# Patient Record
Sex: Male | Born: 1990 | Hispanic: Yes | Marital: Single | Smoking: Former smoker
Health system: Southern US, Community
[De-identification: ages and names within clinical notes are randomized; demographics above are authoritative.]

## PROBLEM LIST (undated history)

## (undated) DIAGNOSIS — G4700 Insomnia, unspecified: Secondary | ICD-10-CM

## (undated) DIAGNOSIS — F329 Major depressive disorder, single episode, unspecified: Secondary | ICD-10-CM

## (undated) DIAGNOSIS — F411 Generalized anxiety disorder: Secondary | ICD-10-CM

## (undated) HISTORY — DX: Major depressive disorder, single episode, unspecified: F32.9

## (undated) HISTORY — DX: Insomnia, unspecified: G47.00

## (undated) HISTORY — DX: Generalized anxiety disorder: F41.1

---

## 2014-11-03 ENCOUNTER — Encounter: Payer: Self-pay | Admitting: Emergency Medicine

## 2014-11-03 ENCOUNTER — Emergency Department (INDEPENDENT_AMBULATORY_CARE_PROVIDER_SITE_OTHER)
Admission: EM | Admit: 2014-11-03 | Discharge: 2014-11-03 | Disposition: A | Discharge status: Home / Self Care | Payer: Managed Care, Other (non HMO) | Source: Home / Self Care | Attending: Emergency Medicine | Admitting: Emergency Medicine

## 2014-11-03 DIAGNOSIS — R3915 Urgency of urination: Secondary | ICD-10-CM | POA: Diagnosis not present

## 2014-11-03 DIAGNOSIS — R350 Frequency of micturition: Secondary | ICD-10-CM

## 2014-11-03 LAB — POCT URINALYSIS DIP (MANUAL ENTRY)
Bilirubin, UA: NEGATIVE
Blood, UA: NEGATIVE
Glucose, UA: NEGATIVE
Ketones, POC UA: NEGATIVE
Leukocytes, UA: NEGATIVE
Nitrite, UA: NEGATIVE
Protein Ur, POC: NEGATIVE
Urobilinogen, UA: 0.2 (ref 0–1)
pH, UA: 5.5 (ref 5–8)

## 2014-11-03 NOTE — ED Notes (Signed)
Patient c/o urinary urinary urgency times 3 days, denies pain and discomfort. Negative fever.

## 2014-11-03 NOTE — ED Provider Notes (Signed)
CSN: [id number]     Arrival date & time 11/03/14  1148 History   First MD Initiated Contact with Patient 11/03/14 1205     Chief Complaint  Patient presents with  . Urinary Urgency    HPI Patient c/o urinary urgency times 3 days, denies pain and discomfort. The urgency is worse at night, and has disrupted his sleep somewhat.  No problems with urinary stream Denies fever or chills or abdominal pain or nausea or vomiting. No urethral discharge or any other GU symptoms. No skin lesions or sores. No cardiorespiratory symptoms. He never had this before. He is monogamous with girlfriend for 1 year. However his girlfriend is in [place] for the past 6 months, so he denies any sexual relations in the past 6 months. Denies itch or rash in the GU area. No new soaps or detergents or allergens known. History reviewed. No pertinent past medical history. History reviewed. No pertinent past surgical history. History reviewed. No pertinent family history. History  Substance Use Topics  . Smoking status: Never Smoker   . Smokeless tobacco: Not on file  . Alcohol Use: No    Review of Systems Remainder of Review of Systems negative for acute change except as noted in the HPI.  Allergies  Review of patient's allergies indicates not on file.  Home Medications   Prior to Admission medications   Not on File   BP 124/73 mmHg  Pulse 65  Temp(Src) 98.1 F (36.7 C) (Oral)  Resp 16  Ht 5\' 8"  (1.727 m)  Wt 180 lb (81.647 kg)  BMI 27.38 kg/m2  SpO2 98% Physical Exam  Constitutional: He is oriented to person, place, and time. He appears well-developed and well-nourished. No distress.  HENT:  Head: Normocephalic and atraumatic.  Eyes: Conjunctivae and EOM are normal. Pupils are equal, round, and reactive to light. No scleral icterus.  Neck: Normal range of motion.  Cardiovascular: Normal rate.   Pulmonary/Chest: Effort normal.  Abdominal: He exhibits no distension.  Musculoskeletal: Normal range  of motion.  Neurological: He is alert and oriented to person, place, and time.  Skin: Skin is warm.  Psychiatric: He has a normal mood and affect.  Nursing note and vitals reviewed.  He declined any GU exam ED Course  Procedures (including critical care time) Labs Review  Urinalysis today within normal limits. Negative for glucose, blood, protein, leukocytes, nitrates. I reviewed this with patient.--I explained negative for glucose in the urine essentially rules out diabetes as a cause for his symptoms. MDM   1. Urinary urgency   2. Urinary frequency    Treatment options discussed, as well as risks, benefits, alternatives. Patient voiced understanding and agreement with the following plans: Send off urine for chlamydia and GC test He declined any other testing today. Handwritten prescription for doxycycline 100 mg twice a day 10 days Follow-up with your primary care doctor in 5-7 days if not improving, or sooner if symptoms become worse. Precautions discussed. Red flags discussed. Questions invited and answered.--Other advice given Patient voiced understanding and agreement. Over 30 minutes spent, greater than 50% of the time spent for counseling and coordination of care.      Lajean Manesavid Massey, MD 11/03/14 [phone]

## 2014-11-06 LAB — GC/CHLAMYDIA PROBE AMP, URINE
Chlamydia, Swab/Urine, PCR: NEGATIVE
GC Probe Amp, Urine: NEGATIVE

## 2014-11-07 ENCOUNTER — Telehealth: Payer: Self-pay | Admitting: Emergency Medicine

## 2014-11-22 ENCOUNTER — Encounter: Payer: Self-pay | Admitting: Emergency Medicine

## 2014-11-22 ENCOUNTER — Emergency Department (INDEPENDENT_AMBULATORY_CARE_PROVIDER_SITE_OTHER)
Admission: EM | Admit: 2014-11-22 | Discharge: 2014-11-22 | Disposition: A | Discharge status: Home / Self Care | Payer: Managed Care, Other (non HMO) | Source: Home / Self Care | Attending: Emergency Medicine | Admitting: Emergency Medicine

## 2014-11-22 DIAGNOSIS — G4700 Insomnia, unspecified: Secondary | ICD-10-CM

## 2014-11-22 DIAGNOSIS — R350 Frequency of micturition: Secondary | ICD-10-CM

## 2014-11-22 MED ORDER — ZOLPIDEM TARTRATE 10 MG PO TABS: 10.0000 mg | ORAL_TABLET | Freq: Every evening | ORAL | Status: DC | PRN

## 2014-11-22 NOTE — ED Notes (Signed)
Pt c/o urinary frequency x3 weeks. He was seen here x2 weeks ago with same sxs. He thinks this is worse when he is feeling anxious. Denies fever or pain. States he did finish the abx he was given.

## 2014-11-22 NOTE — ED Provider Notes (Signed)
CSN: [id number]     Arrival date & time 11/22/14  4098 History   First MD Initiated Contact with Patient 11/22/14 1020    [facility] Chief Complaint  Patient presents with  . Urinary Frequency   insomnia  HPI Pt c/o vague urinary frequency x3 weeks. Only when he is trying to go to sleep at night. He was seen here 11/03/2014 with same sxs. He thinks this is worse when he is feeling anxious. Denies fever or pain. States he did finish the doxycycline abx he was prescribed here on 11/03/2014. I reviewed those notes extensively.  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 He states he enjoys his job. Complains of inability to fall asleep.  Prior to changing shifts a month ago, he denies any prior insomnia or anxiety or any psychiatric illness or symptoms. He denies any depression or suicidal or homicidal ideation. Denies illegal drugs. Does not smoke or drink. History reviewed. No pertinent past medical history. History reviewed. No pertinent past surgical history. Family History  Problem Relation Age of Onset  . Family history unknown: Yes   History  Substance Use Topics  . Smoking status: Never Smoker   . Smokeless tobacco: Not on file  . Alcohol Use: No    Review of Systems Remainder of Review of Systems negative for acute change except as noted in the HPI.  Allergies  Review of patient's allergies indicates not on file.  Home Medications   Prior to Admission medications   Medication Sig Start Date End Date Taking? Authorizing Provider  zolpidem (AMBIEN) 10 MG tablet Take 1 tablet (10 mg total) by mouth at bedtime as needed for sleep. 11/22/14   Lajean Manes, MD   BP 112/67 mmHg  Pulse 68  Temp(Src) 97.4 F (36.3 C) (Oral)  SpO2 97% Physical Exam  Constitutional: He is oriented to person, place, and time. He  appears well-developed and well-nourished. No distress.  HENT:  Head: Normocephalic and atraumatic.  Eyes: Conjunctivae and EOM are normal. Pupils are equal, round, and reactive to light. No scleral icterus.  Neck: Normal range of motion.  Cardiovascular: Normal rate.   Pulmonary/Chest: Effort normal.  Abdominal: He exhibits no distension.  Musculoskeletal: Normal range of motion.  Neurological: He is alert and oriented to person, place, and time.  Skin: Skin is warm.  Psychiatric: He has a normal mood and affect.  Nursing note and vitals reviewed.  He declined any other exam today. He is alert, cooperative. ED Course  Procedures (including critical care time) Labs Review Labs Reviewed - No data to display  Imaging Review No results found.   MDM   1. Urinary frequency   2. Insomnia    In my opinion, urinary frequency at night is likely related to insomnia the past month from shift change.  see extensive notes from 11/03/2014 Treatment options discussed, as well as risks, benefits, alternatives. Patient voiced understanding and agreement with the following plans:  Short-term prescription Ambien 10 mg at bedtime to "jump start his sleep cycle".  #15, one refill. New Prescriptions   ZOLPIDEM (AMBIEN) 10 MG TABLET    Take 1 tablet (10 mg total) by mouth at bedtime as needed for sleep.    Advised to avoid sleeping during the day and only sleep at night so he can go to his first  shift job refreshed. Other advice given. He does not have a PCP, so we made him an appointment to establish care and follow-up insomnia with Dr. Denyse Amass on 12/19/2014 Precautions discussed. Red flags discussed. Questions invited and answered. Patient voiced understanding and agreement.   Lajean Manes, MD 11/22/14 1102

## 2014-12-19 ENCOUNTER — Ambulatory Visit: Payer: Managed Care, Other (non HMO) | Admitting: Family Medicine

## 2014-12-20 ENCOUNTER — Emergency Department (INDEPENDENT_AMBULATORY_CARE_PROVIDER_SITE_OTHER)
Admission: EM | Admit: 2014-12-20 | Discharge: 2014-12-20 | Disposition: A | Discharge status: Home / Self Care | Payer: Managed Care, Other (non HMO) | Source: Home / Self Care | Attending: Family Medicine | Admitting: Family Medicine

## 2014-12-20 ENCOUNTER — Encounter: Payer: Self-pay | Admitting: *Deleted

## 2014-12-20 DIAGNOSIS — F419 Anxiety disorder, unspecified: Secondary | ICD-10-CM | POA: Diagnosis not present

## 2014-12-20 DIAGNOSIS — G4700 Insomnia, unspecified: Secondary | ICD-10-CM

## 2014-12-20 NOTE — ED Notes (Signed)
Pt is here for complaint of anxiety and insomnia which he was seen for on 11/22/14. He reports he has not slept and feeling extremely anxious x 2 days and did not go to work because of this. He is out of the medication Dr. Georgina PillionMassey prescribed. I reminded him there is a refill which he not aware of. He missed his PCP establishment apt yesterday but is now rescheduled from 12/24/14 @ 10am with Dr. Denyse Amassorey.

## 2014-12-20 NOTE — ED Provider Notes (Signed)
CSN: [id number]     Arrival date & time 12/20/14  1150 History   First MD Initiated Contact with Patient 12/20/14 1216     Chief Complaint  Patient presents with  . Anxiety   (Consider location/radiation/quality/duration/timing/severity/associated sxs/prior Treatment) HPI Patient is a 24 year old male presenting to urgent care with complaint of anxiety and insomnia for the last 2 months after starting a new job. Pt states he worked 3rd shift for about 4 years and is now working first shift but finds it hard to sleep at night and feels very anxious at night. Patient was seen initially on 11/03/14 for urinary frequency that kept him up all night, he was seen again on 11/22/14 for similar symptoms but it was determined pt was having insomnia which is likely causing his urinary frequency due to already being awake.  He was started on Ambien to help "jump start" his new sleep cycle. Pt states it does help when he takes the medication but is now out and was unaware he had a refill on the medication.  Pt states he missed work yesterday and today due to feeling significant fatigue from being up all night but cannot seem to fall asleep.  Pt accidentally missed his appointment with PCP, Dr. Denyse Amass, yesterday. He has reschedule for Monday July 18th.  Today, pt requesting a work note and plans to refill his Ambien now that he knows he has a refill. Denies SI/HI. Denies alcohol or drug use. Denies use of caffeine. Denies prior hx of insomnia. Denies increased stress at his new job.    History reviewed. No pertinent past medical history. History reviewed. No pertinent past surgical history. Family History  Problem Relation Age of Onset  . Family history unknown: Yes   History  Substance Use Topics  . Smoking status: Never Smoker   . Smokeless tobacco: Not on file  . Alcohol Use: No    Review of Systems  Constitutional: Positive for fatigue. Negative for fever and chills.  Respiratory: Negative for cough  and shortness of breath.   Cardiovascular: Negative for chest pain and palpitations.  Gastrointestinal: Negative for nausea, vomiting, abdominal pain and diarrhea.  Genitourinary: Negative for dysuria, urgency, hematuria and flank pain.  Neurological: Negative for dizziness, light-headedness and headaches.  Psychiatric/Behavioral: Positive for sleep disturbance. Negative for suicidal ideas, hallucinations and self-injury. The patient is nervous/anxious.   All other systems reviewed and are negative.   Allergies  Review of patient's allergies indicates no known allergies.  Home Medications   Prior to Admission medications   Medication Sig Start Date End Date Taking? Authorizing Provider  zolpidem (AMBIEN) 10 MG tablet Take 1 tablet (10 mg total) by mouth at bedtime as needed for sleep. 11/22/14   Lajean Manes, MD   BP 114/71 mmHg  Pulse 73  Temp(Src) 98.1 F (36.7 C) (Oral)  Resp 14  Wt 194 lb (87.998 kg)  SpO2 99% Physical Exam  Constitutional: He appears well-developed and well-nourished.  Pt sitting in exam chair, appears anxious, fidgeting   HENT:  Head: Normocephalic and atraumatic.  Eyes: Conjunctivae are normal. No scleral icterus.  Neck: Normal range of motion.  Cardiovascular: Normal rate, regular rhythm and normal heart sounds.   Pulmonary/Chest: Effort normal and breath sounds normal. No respiratory distress. He has no wheezes. He has no rales. He exhibits no tenderness.  Abdominal: Soft. Bowel sounds are normal. He exhibits no distension and no mass. There is no tenderness. There is no rebound and no guarding.  Musculoskeletal:  Normal range of motion.  Neurological: He is alert.  Skin: Skin is warm and dry.  Psychiatric: His speech is normal and behavior is normal. Thought content normal. His mood appears anxious. He expresses no homicidal and no suicidal ideation.  Nursing note and vitals reviewed.   ED Course  Procedures (including critical care time) Labs  Review Labs Reviewed - No data to display  Imaging Review No results found.   MDM   1. Insomnia   2. Anxiety     Patient is a very pleasant 24 year old male presenting to urgent care with complaint of anxiety and insomnia for last 2 months after starting a new job.  Medical records reviewed which indicate patient has refill of Ambien.  Patient does state this medication helps.  Patient plans to fill medication today.  Patient is anxious during exam.  Denies SI or HI.  Denies self-harm.  Denies drug use or caffeine use.  Denies any significant past medical history.    Patient does have follow-up with PCP, Dr. Denyse Amassorey on Monday, July 18.  No indication for further workup in urgent care setting at this time.  Reviewed good habits for bedtime routine.  Patient verbalized understanding and agreement with treatment plan.  Work note provided today.  Junius Finnerrin O'Malley, PA-C 12/20/14 1238

## 2014-12-20 NOTE — Discharge Instructions (Signed)
Please take Ambien Insomnia Insomnia is frequent trouble falling and/or staying asleep. Insomnia can be a long term problem or a short term problem. Both are common. Insomnia can be a short term problem when the wakefulness is related to a certain stress or worry. Long term insomnia is often related to ongoing stress during waking hours and/or poor sleeping habits. Overtime, sleep deprivation itself can make the problem worse. Every little thing feels more severe because you are overtired and your ability to cope is decreased. CAUSES   Stress, anxiety, and depression.  Poor sleeping habits.  Distractions such as TV in the bedroom.  Naps close to bedtime.  Engaging in emotionally charged conversations before bed.  Technical reading before sleep.  Alcohol and other sedatives. They may make the problem worse. They can hurt normal sleep patterns and normal dream activity.  Stimulants such as caffeine for several hours prior to bedtime.  Pain syndromes and shortness of breath can cause insomnia.  Exercise late at night.  Changing time zones may cause sleeping problems (jet lag). It is sometimes helpful to have someone observe your sleeping patterns. They should look for periods of not breathing during the night (sleep apnea). They should also look to see how long those periods last. If you live alone or observers are uncertain, you can also be observed at a sleep clinic where your sleep patterns will be professionally monitored. Sleep apnea requires a checkup and treatment. Give your caregivers your medical history. Give your caregivers observations your family has made about your sleep.  SYMPTOMS   Not feeling rested in the morning.  Anxiety and restlessness at bedtime.  Difficulty falling and staying asleep. TREATMENT   Your caregiver may prescribe treatment for an underlying medical disorders. Your caregiver can give advice or help if you are using alcohol or other drugs for  self-medication. Treatment of underlying problems will usually eliminate insomnia problems.  Medications can be prescribed for short time use. They are generally not recommended for lengthy use.  Over-the-counter sleep medicines are not recommended for lengthy use. They can be habit forming.  You can promote easier sleeping by making lifestyle changes such as:  Using relaxation techniques that help with breathing and reduce muscle tension.  Exercising earlier in the day.  Changing your diet and the time of your last meal. No night time snacks.  Establish a regular time to go to bed.  Counseling can help with stressful problems and worry.  Soothing music and white noise may be helpful if there are background noises you cannot remove.  Stop tedious detailed work at least one hour before bedtime. HOME CARE INSTRUCTIONS   Keep a diary. Inform your caregiver about your progress. This includes any medication side effects. See your caregiver regularly. Take note of:  Times when you are asleep.  Times when you are awake during the night.  The quality of your sleep.  How you feel the next day. This information will help your caregiver care for you.  Get out of bed if you are still awake after 15 minutes. Read or do some quiet activity. Keep the lights down. Wait until you feel sleepy and go back to bed.  Keep regular sleeping and waking hours. Avoid naps.  Exercise regularly.  Avoid distractions at bedtime. Distractions include watching television or engaging in any intense or detailed activity like attempting to balance the household checkbook.  Develop a bedtime ritual. Keep a familiar routine of bathing, brushing your teeth, climbing into bed  at the same time each night, listening to soothing music. Routines increase the success of falling to sleep faster.  Use relaxation techniques. This can be using breathing and muscle tension release routines. It can also include visualizing  peaceful scenes. You can also help control troubling or intruding thoughts by keeping your mind occupied with boring or repetitive thoughts like the old concept of counting sheep. You can make it more creative like imagining planting one beautiful flower after another in your backyard garden.  During your day, work to eliminate stress. When this is not possible use some of the previous suggestions to help reduce the anxiety that accompanies stressful situations. MAKE SURE YOU:   Understand these instructions.  Will watch your condition.  Will get help right away if you are not doing well or get worse. Document Released: 05/22/2000 Document Revised: 08/17/2011 Document Reviewed: 06/22/2007 [facility] Patient Information 2015 [place], [place]. This information is not intended to replace advice given to you by your health care provider. Make sure you discuss any questions you have with your health care provider.  sleeping medication as prescribed by Dr. Georgina Pillion and be sure to follow up with primary care on Monday with Dr. Denyse Amass.   Try to avoid caffeine in the afternoon including chocolate, coffee, tea, and sodas. Avoid napping during the day.  Try to go to bed at the same time every night and wake at the same time every morning. Avoid napping during the day. You may try a warm shower or bath as a new bedtime routine to help calm you down.  Dim your lights and avoid "screen time" including phones, tablets, computers, laptops and television at least 1 hour prior to bed.  Avoid eating a large meal at least 3 hours before bed.  See below for other suggestions on good sleep hygiene.

## 2014-12-24 ENCOUNTER — Ambulatory Visit: Payer: Managed Care, Other (non HMO) | Admitting: Family Medicine

## 2015-01-11 ENCOUNTER — Emergency Department (INDEPENDENT_AMBULATORY_CARE_PROVIDER_SITE_OTHER)
Admission: EM | Admit: 2015-01-11 | Discharge: 2015-01-11 | Disposition: A | Discharge status: Home / Self Care | Payer: Managed Care, Other (non HMO) | Source: Home / Self Care | Attending: Family Medicine | Admitting: Family Medicine

## 2015-01-11 ENCOUNTER — Encounter: Payer: Self-pay | Admitting: Emergency Medicine

## 2015-01-11 DIAGNOSIS — S76011A Strain of muscle, fascia and tendon of right hip, initial encounter: Secondary | ICD-10-CM | POA: Diagnosis not present

## 2015-01-11 MED ORDER — MELOXICAM 15 MG PO TABS: 15.0000 mg | ORAL_TABLET | Freq: Every day | ORAL | Status: DC

## 2015-01-11 NOTE — ED Notes (Signed)
Pt c/o SOB starting today...states it is difficult to take a deep breath. Also c/o intermittent lower right sided abdominal pain.

## 2015-01-11 NOTE — ED Provider Notes (Signed)
CSN: [id number]     Arrival date & time 01/11/15  1744 History   First MD Initiated Contact with Patient 01/11/15 1906     Chief Complaint  Patient presents with  . Shortness of Breath      HPI Comments: Patient has had intermittent right lower quadrant pain for the past two days, and today he began feeling shortness of breath whenever he felt pain in his lower abdomen.  No pleuritic pain or cough.  No fevers, chills, and sweats.  No recent abdominal injury.  He notes that he is quite physically active and has noted that pain in his right lower quadrant is worse when running and flexing his right hip.  No GU symptoms   Patient is a 24 y.o. male presenting with abdominal pain. The history is provided by the patient.  Abdominal Pain Pain location:  RLQ Pain quality: aching   Pain radiates to:  Does not radiate Pain severity:  Mild Onset quality:  Sudden Duration:  2 days Timing:  Intermittent Progression:  Unchanged Chronicity:  New Context comment:  Physical activity Relieved by:  None tried Worsened by:  Deep breathing Ineffective treatments:  None tried Associated symptoms: shortness of breath   Associated symptoms: no anorexia, no chest pain, no chills, no constipation, no cough, no diarrhea, no dysuria, no fatigue, no fever, no hematemesis, no hematochezia, no hematuria, no nausea and no vomiting     History reviewed. No pertinent past medical history. History reviewed. No pertinent past surgical history. Family History  Problem Relation Age of Onset  . Family history unknown: Yes   History  Substance Use Topics  . Smoking status: Never Smoker   . Smokeless tobacco: Not on file  . Alcohol Use: No    Review of Systems  Constitutional: Negative for fever, chills and fatigue.  Respiratory: Positive for shortness of breath. Negative for cough.   Cardiovascular: Negative for chest pain.  Gastrointestinal: Positive for abdominal pain. Negative for nausea, vomiting, diarrhea,  constipation, hematochezia, anorexia and hematemesis.  Genitourinary: Negative for dysuria and hematuria.  All other systems reviewed and are negative.   Allergies  Review of patient's allergies indicates no known allergies.  Home Medications   Prior to Admission medications   Medication Sig Start Date End Date Taking? Authorizing Provider  meloxicam (MOBIC) 15 MG tablet Take 1 tablet (15 mg total) by mouth daily. Take with food each morning 01/11/15   Lattie Haw, MD  zolpidem (AMBIEN) 10 MG tablet Take 1 tablet (10 mg total) by mouth at bedtime as needed for sleep. 11/22/14   Lajean Manes, MD   BP 115/76 mmHg  Pulse 98  Temp(Src) 98.4 F (36.9 C) (Oral)  SpO2 97% Physical Exam  Constitutional: He is oriented to person, place, and time. He appears well-developed and well-nourished. No distress.  HENT:  Head: Normocephalic.  Nose: Nose normal.  Mouth/Throat: Oropharynx is clear and moist.  Eyes: Conjunctivae are normal. Pupils are equal, round, and reactive to light.  Neck: Normal range of motion. Neck supple.  Cardiovascular: Normal heart sounds.   Pulmonary/Chest: Breath sounds normal. He exhibits no tenderness.  Abdominal: Bowel sounds are normal. He exhibits no mass. There is tenderness. There is no rebound and no guarding.  Musculoskeletal: He exhibits no edema.       Right hip: He exhibits tenderness. He exhibits normal range of motion, normal strength, no bony tenderness and no crepitus.       Legs: Patient has distinct tenderness to  palpation over right inguinal area, and pain is elicited by resisted flexion of the right hip while palpating his hip flexors.  Tenderness extends to the pubic symphysis, and pain is also elicited by resisted adduction of his right hip.  No inguinal adenopathy.  No hernia.  Right hip internal/external rotation normal.  Lymphadenopathy:    He has no cervical adenopathy.  Neurological: He is alert and oriented to person, place, and time.    Skin: Skin is warm and dry. No rash noted.  Nursing note and vitals reviewed.   ED Course  Procedures  none  MDM   1. Strain of hip flexor, right, initial encounter    Begin Mobic 15mg  daily. Apply ice pack for 20 to 30 minutes, every 3 to 4 hours for 2 to 3 days.  Continue until pain decreases.  Begin range of motion and stretching exercises. Followup with Dr. Rodney Langton or Dr. Clementeen Graham ([facility]) if not improving about two weeks.     Lattie Haw, MD 01/13/15 [phone]

## 2015-01-11 NOTE — Discharge Instructions (Signed)
Apply ice pack for 20 to 30 minutes, every 3 to 4 hours for 2 to 3 days.  Continue until pain decreases.  Begin range of motion and stretching exercises.

## 2015-01-24 ENCOUNTER — Ambulatory Visit (INDEPENDENT_AMBULATORY_CARE_PROVIDER_SITE_OTHER): Payer: Managed Care, Other (non HMO) | Admitting: Family Medicine

## 2015-01-24 ENCOUNTER — Encounter: Payer: Self-pay | Admitting: Family Medicine

## 2015-01-24 VITALS — BP 116/67 | HR 58 | Ht 68.0 in | Wt 192.0 lb

## 2015-01-24 DIAGNOSIS — R300 Dysuria: Secondary | ICD-10-CM | POA: Diagnosis not present

## 2015-01-24 DIAGNOSIS — S76311A Strain of muscle, fascia and tendon of the posterior muscle group at thigh level, right thigh, initial encounter: Secondary | ICD-10-CM | POA: Diagnosis not present

## 2015-01-24 DIAGNOSIS — Z23 Encounter for immunization: Secondary | ICD-10-CM | POA: Diagnosis not present

## 2015-01-24 DIAGNOSIS — S76319A Strain of muscle, fascia and tendon of the posterior muscle group at thigh level, unspecified thigh, initial encounter: Secondary | ICD-10-CM | POA: Insufficient documentation

## 2015-01-24 DIAGNOSIS — G4700 Insomnia, unspecified: Secondary | ICD-10-CM | POA: Insufficient documentation

## 2015-01-24 MED ORDER — ZOLPIDEM TARTRATE 10 MG PO TABS: 10.0000 mg | ORAL_TABLET | Freq: Every evening | ORAL | Status: DC | PRN

## 2015-01-24 NOTE — Progress Notes (Signed)
   Subjective:    I'm seeing this patient as a consultation for:  Dr. Cathren Harsh  CC: Right knee pain, and insomnia, and penile discomfort.   HPI:  1) right leg pain: Patient has a six-month history for pain in his right posterior knee. Pain is moderate and worse with activity. He works as a Glass blower/designer at [employer]. He notes the pain is nonradiating with no weakness or numbness. He denies any injury. No fevers chills nausea vomiting or diarrhea. He's tried meloxicam which helps.  2) additionally patient would like to be seen for insomnia. He notes difficulty sleeping for the past several months. He feels a sensation as though he needs to urinate prior to sleep. This keeps him up. He has been evaluated in urgent care for this in the past and found not to have STDs. He does not feel this symptom any other time. He is using Ambien which helps significantly. He would like a refill today if possible.   Past medical history, Surgical history, Family history not pertinant except as noted below, Social history, Allergies, and medications have been entered into the medical record, reviewed, and no changes needed.   Review of Systems: No headache, visual changes, nausea, vomiting, diarrhea, constipation, dizziness, abdominal pain, skin rash, fevers, chills, night sweats, weight loss, swollen lymph nodes, body aches, joint swelling, muscle aches, chest pain, shortness of breath, mood changes, visual or auditory hallucinations.   Objective:    Filed Vitals:   01/24/15 1454  BP: 116/67  Pulse: 58   General: Well Developed, well nourished, and in no acute distress.  Neuro/Psych: Alert and oriented x3, extra-ocular muscles intact, able to move all 4 extremities, sensation grossly intact. Skin: Warm and dry, no rashes noted.  Respiratory: Not using accessory muscles, speaking in full sentences, trachea midline.  Cardiovascular: Pulses palpable, no extremity edema. Abdomen: Does not  appear distended. MSK:  Right knee normal-appearing Range of motion 0-120. Tender to palpation distal biceps for Morris tendon near the insertion at the knee. Pain with resisted knee flexion. Nontender to joint line. Stable ligamentous exam. Negative McMurray's and Lachman's and anterior and posterior drawer test. Positive H test.   No results found for this or any previous visit (from the past 24 hour(s)). No results found.  Impression and Recommendations:   This case required medical decision making of moderate complexity.

## 2015-01-24 NOTE — Patient Instructions (Signed)
Thank you for coming in today. 1) Hamstring exercises.   Look up askling hamstring exercises to rehab your hamstring.  Consider getting a thigh sleeve.   2) Use ambien for sleep.   Come back in 1 month for recheck.   Hamstring Strain with Rehab The hamstring muscle and tendons are vulnerable to muscle or tendon tear (strain). Hamstring tears cause pain and inflammation in the backside of the thigh, where the hamstring muscles are located. The hamstring is comprised of three muscles that are responsible for straightening the hip, bending the knee, and stabilizing the knee. These muscles are important for walking, running, and jumping. Hamstring strain is the most common injury of the thigh. Hamstring strains are classified as grade 1 or 2 strains. Grade 1 strains cause pain, but the tendon is not lengthened. Grade 2 strains include a lengthened ligament due to the ligament being stretched or partially ruptured. With grade 2 strains there is still function, although the function may be decreased.  SYMPTOMS   Pain, tenderness, swelling, warmth, or redness over the hamstring muscles, at the back of the thigh.  Pain that gets worse during and after intense activity.  A "pop" heard in the area, at the time of injury.  Muscle spasm in the hamstring muscles.  Pain or weakness with running, jumping, or bending the knee against resistance.  Crackling sound (crepitation) when the tendon is moved or touched.  Bruising (contusion) in the thigh within 48 hours of injury.  Loss of fullness of the muscle, or area of muscle bulging in the case of a complete rupture. CAUSES  A muscle strain occurs when a force is placed on the muscle or tendon that is greater than it can withstand. Common causes of injury include:  Strain from overuse or sudden increase in the frequency, duration, or intensity of activity.  Single violent blow or force to the back of the knee or the hamstring area of the  thigh. RISK INCREASES WITH:  Sports that require quick starts (sprinting, racquetball, tennis).  Sports that require jumping (basketball, volleyball).  Kicking sports and water skiing.  Contact sports (soccer, football).  Poor strength and flexibility.  Failure to warm up properly before activity.  Previous thigh, knee, or pelvis injury.  Poor exercise technique.  Poor posture. PREVENTION  Maintain physical fitness:  Strength, flexibility, and endurance.  Cardiovascular fitness.  Learn and use proper exercise technique and posture.  Wear proper fitted and padded protective equipment. PROGNOSIS  If treated properly, hamstring strains are usually curable in 2 to 6 weeks. RELATED COMPLICATIONS   Longer healing time, if not properly treated or if not given adequate time to heal.  Chronically inflamed tendon, causing persistent pain with activity that may progress to constant pain.  Recurring symptoms, if activity is resumed too soon.  Vulnerable to repeated injury (in up to 25% of cases). TREATMENT  Treatment first involves the use of ice and medication to help reduce pain and inflammation. It is also important to complete strengthening and stretching exercises, as well as modifying any activities that aggravate the symptoms. These exercises may be completed at home or with a therapist. Your caregiver may recommend the use of crutches to help reduce pain and discomfort, especially is the strain is severe enough to cause limping. If the tendon has pulled away from the bone, then surgery is necessary to reattach it. MEDICATION   If pain medicine is needed, nonsteroidal anti-inflammatory medicines (aspirin and ibuprofen), or other minor pain relievers (acetaminophen),  are often advised.  Do not take pain medicine for 7 days before surgery.  Prescription pain relievers may be given if your caregiver thinks they are needed. Use only as directed and only as much as you  need.  Corticosteroid injections may be recommended. However, these injections should only be used for serious cases, as they can only be given a certain number of times.  Ointments applied to the skin may be beneficial. HEAT AND COLD  Cold treatment (icing) relieves pain and reduces inflammation. Cold treatment should be applied for 10 to 15 minutes every 2 to 3 hours, and immediately after activity that aggravates your symptoms. Use ice packs or an ice massage.  Heat treatment may be used before performing the stretching and strengthening activities prescribed by your caregiver, physical therapist, or athletic trainer. Use a heat pack or a warm water soak. SEEK MEDICAL CARE IF:   Symptoms get worse or do not improve in 2 weeks, despite treatment.  New, unexplained symptoms develop. (Drugs used in treatment may produce side effects.) EXERCISES RANGE OF MOTION (ROM) AND STRETCHING EXERCISES - Hamstring Strain These exercises may help you when beginning to rehabilitate your injury. Your symptoms may go away with or without further involvement from your physician, physical therapist or athletic trainer. While completing these exercises, remember:   Restoring tissue flexibility helps normal motion to return to the joints. This allows healthier, less painful movement and activity.  An effective stretch should be held for at least 30 seconds.  A stretch should never be painful. You should only feel a gentle lengthening or release in the stretched tissue. STRETCH - Hamstrings, Standing  Stand or sit, and extend your right / left leg, placing your foot on a chair or foot stool.  Keep a slight arch in your low back and your hips straight forward.  Lead with your chest, and lean forward at the waist until you feel a gentle stretch in the back of your right / left knee or thigh. (When done correctly, this exercise requires leaning only a small distance.)  Hold this position for __________  seconds. Repeat __________ times. Complete this stretch __________ times per day. STRETCH - Hamstrings, Supine   Lie on your back. Loop a belt or towel over the ball of your right / left foot.  Straighten your right / left knee and slowly pull on the belt to raise your leg. Do not allow the right / left knee to bend. Keep your opposite leg flat on the floor.  Raise the leg until you feel a gentle stretch behind your right / left knee or thigh. Hold this position for __________ seconds. Repeat __________ times. Complete this stretch __________ times per day.  STRETCH - Hamstrings, Doorway  Lie on your back with your right / left leg extended and resting on the wall, and the opposite leg flat on the ground through the door. Initially, position your bottom farther away from the wall.  Keep your right / left knee straight. If you feel a stretch behind your knee or thigh, hold this position for __________ seconds.  If you do not feel a stretch, scoot your bottom closer to the door and hold __________ seconds. Repeat __________ times. Complete this stretch __________ times per day.  STRETCH - Hamstrings/Adductors, V-Sit   Sit on the floor with your legs extended in a large "V," keeping your knees straight.  With your head and chest upright, bend at your waist reaching for your left foot  to stretch your right thigh muscles.  You should feel a stretch in your right inner thigh. Hold for __________ seconds.  Return to the upright position to relax your leg muscles.  Continuing to keep your chest upright, bend straight forward at your waist to stretch your hamstrings.  You should feel a stretch behind both of your thighs and knees. Hold for __________ seconds.  Return to the upright position to relax your leg muscles.  With your head and chest upright, bend at your waist reaching for your right foot to stretch your left thigh muscles.  You should feel a stretch in your left inner thigh.  Hold for __________ seconds.  Return to the upright position to relax your leg muscles. Repeat __________ times. Complete this exercise __________ times per day.  STRENGTHENING EXERCISES - Hamstring Strain These exercises may help you when beginning to rehabilitate your injury. They may resolve your symptoms with or without further involvement from your physician, physical therapist or athletic trainer. While completing these exercises, remember:   Muscles can gain both the endurance and the strength needed for everyday activities through controlled exercises.  Complete these exercises as instructed by your physician, physical therapist or athletic trainer. Increase the resistance and repetitions only as guided.  You may experience muscle soreness or fatigue, but the pain or discomfort you are trying to eliminate should never get worse during these exercises. If this pain does get worse, stop and make certain you are following the directions exactly. If the pain is still present after adjustments, discontinue the exercise until you can discuss the trouble with your clinician. STRENGTH - Hip Extensors, Straight Leg Raises   Lie on your stomach on a firm surface.  Tense the muscles in your buttocks to lift your right / left leg about 4 inches. If you cannot lift your leg this high without arching your back, place a pillow under your hips.  Keep your knee straight. Hold for __________ seconds.  Slowly lower your leg to the starting position and allow it to relax completely before starting the next repetition. Repeat __________ times. Complete this exercise __________ times per day.  STRENGTH - Hamstring, Isometrics   Lie on your back on a firm surface.  Bend your right / left knee approximately __________ degrees.  Dig your heel into the surface, as if you are trying to pull it toward your buttocks. Tighten the muscles in the back of your thighs to "dig" as hard as you can, without  increasing any pain.  Hold this position for __________ seconds.  Release the tension gradually and allow your muscles to completely relax for __________ seconds between each exercise. Repeat __________ times. Complete this exercise __________ times per day.  STRENGTH - Hamstring, Curls   Lay on your stomach with your legs extended. (If you lay on a bed, your feet may hang over the edge.)  Tighten the muscles in the back of your thigh to bend your right / left knee up to 90 degrees. Keep your hips flat on the bed or floor.  Hold this position for __________ seconds.  Slowly lower your leg back to the starting position. Repeat __________ times. Complete this exercise __________ times per day.  OPTIONAL ANKLE WEIGHTS: Begin with ____________________, but DO NOT exceed ____________________. Increase in 1 pound/0.5 kilogram increments. Document Released: 05/25/2005 Document Revised: 08/17/2011 Document Reviewed: 09/06/2008 [facility] Patient Information 2015 [place], [place]. This information is not intended to replace advice given to you by your health care  provider. Make sure you discuss any questions you have with your health care provider.

## 2015-01-24 NOTE — Assessment & Plan Note (Signed)
GC chlamydia negative in the past. Urine culture pending

## 2015-01-24 NOTE — Assessment & Plan Note (Signed)
Continue Ambien. 

## 2015-01-24 NOTE — Addendum Note (Signed)
Addended by: Minna Antis T on: 01/24/2015 04:08 PM   Modules accepted: Orders

## 2015-01-24 NOTE — Assessment & Plan Note (Signed)
Symptoms most consistent with hamstring strain. Work with Askling exercises and meloxicam. Return in one month

## 2015-01-26 LAB — URINE CULTURE
Colony Count: NO GROWTH
ORGANISM ID, BACTERIA: NO GROWTH

## 2015-02-14 ENCOUNTER — Ambulatory Visit (INDEPENDENT_AMBULATORY_CARE_PROVIDER_SITE_OTHER): Payer: Managed Care, Other (non HMO) | Admitting: Family Medicine

## 2015-02-14 ENCOUNTER — Encounter: Payer: Self-pay | Admitting: Family Medicine

## 2015-02-14 VITALS — BP 115/73 | HR 75 | Ht 68.0 in | Wt 192.0 lb

## 2015-02-14 DIAGNOSIS — S76311D Strain of muscle, fascia and tendon of the posterior muscle group at thigh level, right thigh, subsequent encounter: Secondary | ICD-10-CM

## 2015-02-14 DIAGNOSIS — M7630 Iliotibial band syndrome, unspecified leg: Secondary | ICD-10-CM | POA: Insufficient documentation

## 2015-02-14 DIAGNOSIS — M7631 Iliotibial band syndrome, right leg: Secondary | ICD-10-CM | POA: Diagnosis not present

## 2015-02-14 NOTE — Patient Instructions (Signed)
Thank you for coming in today. Do the 3 stretches and 1 leg raises.  1) Cross Over stretch 2) Piriformis stretch 3) Standing IT band stretch.  You should get better.  Return in 4 weeks for recheck.    Iliotibial Band Syndrome with Rehab The iliotibial (IT) band is a tendon that connects the hip muscles to the shinbone (tibia) and to one of the bones of the pelvis (ileum). The IT band passes by the knee and is often irritated by the outer portion of the knee (lateral femoral condyle). A fluid filled sac (bursa) exists between the tendon and the bone, to cushion and reduce friction. Overuse of the tendon may cause excessive friction, which results in IT band syndrome. This condition involves inflammation of the bursa (bursitis) and/or inflammation of the IT band (tendinitis). SYMPTOMS   Pain, tenderness, swelling, warmth, or redness over the IT band, at the outer knee (above the joint).  Pain that travels up or down the thigh or leg.  Initially, pain at the beginning of an exercise, that decreases once warmed up. Eventually, pain throughout the activity, getting worse as the activity continues. May cause the athlete to stop in the middle of training or competing.  Pain that gets worse when running down hills or stairs, on banked tracks, or next to the curb on the street.  Pain that increases when the foot of the affected leg hits the ground.  Possibly, a crackling sound (crepitation) when the tendon or bursa is moved or touched. CAUSES  IT band syndrome is caused by irritation of the IT band and the underlying bursa. This eventually results in inflammation and pain. IT band syndrome is an overuse injury.  RISK INCREASES WITH:  Sports with repetitive knee-bending activities (distance running, cycling).  Incorrect training techniques, including sudden changes in the intensity, frequency, or duration of training.  Not enough rest between workouts.  Poor strength and flexibility,  especially a tight IT band.  Failure to warm up properly before activity.  Bow legs.  Arthritis of the knee. PREVENTION   Warm up and stretch properly before activity.  Allow for adequate recovery between workouts.  Maintain physical fitness:  Strength, flexibility, and endurance.  Cardiovascular fitness.  Learn and use proper training technique, including reducing running mileage, shortening stride, and avoiding running on hills and banked surfaces.  Wear arch supports (orthotics), if you have flat feet. PROGNOSIS  If treated properly, IT band syndrome usually goes away within 6 weeks of treatment. RELATED COMPLICATIONS   Longer healing time, if not properly treated, or if not given enough time to heal.  Recurring inflammation of the tendon and bursa, that may result in a chronic condition.  Recurring symptoms, if activity is resumed too soon, with overuse, with a direct blow, or with poor training technique.  Inability to complete training or competition. TREATMENT  Treatment first involves the use of ice and medicine, to reduce pain and inflammation. The use of strengthening and stretching exercises may help reduce pain with activity. These exercises may be performed at home or with a therapist. For individuals with flat feet, an arch support (orthotic) may be helpful. Some individuals find that wearing a knee sleeve or compression bandage around the knee during workouts provides some relief. Certain training techniques, such as adjusting stride length, avoiding running on hills or stairs, changing the direction you run on a circular or banked track, or changing the side of the road you run on, if you run next to  the curb, may help decrease symptoms of IT band syndrome. Cyclists may need to change the seat height or foot position on their bicycles. An injection of cortisone into the bursa may be recommended. Surgery to remove the inflamed bursa and/or part of the IT band is only  considered after at least 6 months of non-surgical treatment.  MEDICATION   If pain medicine is needed, nonsteroidal anti-inflammatory medicines (aspirin and ibuprofen), or other minor pain relievers (acetaminophen), are often advised.  Do not take pain medicine for 7 days before surgery.  Prescription pain relievers may be given, if your caregiver thinks they are needed. Use only as directed and only as much as you need.  Corticosteroid injections may be given by your caregiver. These injections should be reserved for the most serious cases, because they may only be given a certain number of times. HEAT AND COLD  Cold treatment (icing) should be applied for 10 to 15 minutes every 2 to 3 hours for inflammation and pain, and immediately after activity that aggravates your symptoms. Use ice packs or an ice massage.  Heat treatment may be used before performing stretching and strengthening activities prescribed by your caregiver, physical therapist, or athletic trainer. Use a heat pack or a warm water soak. SEEK MEDICAL CARE IF:   Symptoms get worse or do not improve in 2 to 4 weeks, despite treatment.  New, unexplained symptoms develop. (Drugs used in treatment may produce side effects.) EXERCISES  RANGE OF MOTION (ROM) AND STRETCHING EXERCISES - Iliotibial Band Syndrome These exercises may help you when beginning to rehabilitate your injury. Your symptoms may go away with or without further involvement from your physician, physical therapist or athletic trainer. While completing these exercises, remember:   Restoring tissue flexibility helps normal motion to return to the joints. This allows healthier, less painful movement and activity.  An effective stretch should be held for at least 30 seconds.  A stretch should never be painful. You should only feel a gentle lengthening or release in the stretched tissue. STRETCH - Quadriceps, Prone   Lie on your stomach on a firm surface, such as  a bed or padded floor.  Bend your right / left knee and grasp your ankle. If you are unable to reach your ankle or pant leg, use a belt around your foot to lengthen your reach.  Gently pull your heel toward your buttocks. Your knee should not slide out to the side. You should feel a stretch in the front of your thigh and knee.  Hold this position for __________ seconds. Repeat __________ times. Complete this stretch __________ times per day.  STRETCH - Iliotibial Band  On the floor or bed, lie on your side, so your right / left leg is on top. Bend your knee and grab your ankle.  Slowly bring your knee back so that your thigh is in line with your trunk. Keep your heel at your buttocks and gently arch your back, so your head, shoulders and hips line up.  Slowly lower your leg so that your knee approaches the floor or bed, until you feel a gentle stretch on the outside of your right / left thigh. If you do not feel a stretch and your knee will not fall farther, place the heel of your opposite foot on top of your knee, and pull your thigh down farther.  Hold this stretch for __________ seconds. Repeat __________ times. Complete this stretch __________ times per day. STRENGTHENING EXERCISES - Iliotibial Band  Syndrome Improving the flexibility of the IT band will best relieve your discomfort due to IT band syndrome. Strengthening exercises, however, can help improve both muscle endurance and joint mechanics, reducing the factors that can contribute to this condition. Your physician, physical therapist or athletic trainer may provide you with exercises that train specific muscle groups that are especially weak. The following exercises target muscles that are often weak in people who have IT band syndrome. STRENGTH - Hip Abductors, Straight Leg Raises  Be aware of your form throughout the entire exercise, so that you exercise the correct muscles. Poor form means that you are not strengthening the  correct muscles.  Lie on your side, so that your head, shoulders, knee and hip line up. You may bend your lower knee to help maintain your balance. Your right / left leg should be on top.  Roll your hips slightly forward, so that your hips are stacked directly over each other and your right / left knee is facing forward.  Lift your top leg up 4-6 inches, leading with your heel. Be sure that your foot does not drift forward and that your knee does not roll toward the ceiling.  Hold this position for __________ seconds. You should feel the muscles in your outer hip lifting (you may not notice this until your leg begins to tire).  Slowly lower your leg to the starting position. Allow the muscles to fully relax before beginning the next repetition. Repeat __________ times. Complete this exercise __________ times per day.  STRENGTH - Quad/VMO, Isometric  Sit in a chair with your right / left knee slightly bent. With your fingertips, feel the VMO muscle (just above the inside of your knee). The VMO is important in controlling the position of your kneecap.  Keeping your fingertips on this muscle. Without actually moving your leg, attempt to drive your knee down, as if straightening your leg. You should feel your VMO tense. If you have a difficult time, you may wish to try the same exercise on your healthy knee first.  Tense this muscle as hard as you can, without increasing any knee pain.  Hold for __________ seconds. Relax the muscles slowly and completely between each repetition. Repeat __________ times. Complete this exercise __________ times per day.  Document Released: 05/25/2005 Document Revised: 08/17/2011 Document Reviewed: 09/06/2008 [facility] Patient Information 2015 [place], [place]. This information is not intended to replace advice given to you by your health care provider. Make sure you discuss any questions you have with your health care provider.

## 2015-02-15 NOTE — Progress Notes (Signed)
Jeff Rogers is a 24 y.o. male who presents to [facility]  today for right knee pain. Patient presents to clinic to follow-up right knee pain. He was seen in August 18 and diagnosed with a right lateral  distal hamstring strain. He was instructed and shown how to perform Askling hamstring exercises. These exercises helped tremendously. In the interim he's been running and cycling. He notes a new pain in the lateral aspect of his right knee that started a week or 2 ago and is bothersome at work and with exercises. He denies any radiating pain weakness or numbness. She denies any specific injury. He has tried some over-the-counter medicines which helped a little.   No past medical history on file. No past surgical history on file. Social History  Substance Use Topics  . Smoking status: Never Smoker   . Smokeless tobacco: Not on file  . Alcohol Use: No   Family history is unknown by patient.  ROS as above Medications: Current Outpatient Prescriptions  Medication Sig Dispense Refill  . zolpidem (AMBIEN) 10 MG tablet Take 1 tablet (10 mg total) by mouth at bedtime as needed for sleep. 30 tablet 5   No current facility-administered medications for this visit.   No Known Allergies   Exam:  BP 115/73 mmHg  Pulse 75  Ht  (1.727 m)  Wt 192 lb (87.091 kg)  BMI 29.20 kg/m2 Gen: Well NAD HEENT: EOMI,  MMM Lungs: Normal work of breathing. CTABL Heart: RRR no MRG Abd: NABS, Soft. Nondistended, Nontender Exts: Brisk capillary refill, warm and well perfused.  Right knee: Normal-appearing no effusion Full range of motion 0-120 with no significant crepitations Mildly tender to palpation at the area just proximal to the lateral joint line. The posterior lateral hamstring is nontender. Stable ligamentous exam. Negative McMurray's test. Patient has pain with testing of the laxity of the right IT band in the area of his right lateral knee. Additionally  he has pain with IT band and piriformis muscle stretching. He has weak hip abductors pressure on right side 4+/5 compared to the left. His hip range of motion is normal bilaterally. Normal gait  No results found for this or any previous visit (from the past 24 hour(s)). No results found.   Please see individual assessment and plan sections.

## 2015-02-15 NOTE — Assessment & Plan Note (Signed)
Resolved

## 2015-02-15 NOTE — Assessment & Plan Note (Signed)
Distal work on hip abduction strength, and IT band appear for Ms. muscle stretching. Follow-up in 4 weeks

## 2015-02-22 ENCOUNTER — Ambulatory Visit: Payer: Managed Care, Other (non HMO) | Admitting: Family Medicine

## 2015-03-21 ENCOUNTER — Ambulatory Visit: Payer: Managed Care, Other (non HMO) | Admitting: Osteopathic Medicine

## 2015-03-22 ENCOUNTER — Ambulatory Visit (INDEPENDENT_AMBULATORY_CARE_PROVIDER_SITE_OTHER): Payer: Managed Care, Other (non HMO) | Admitting: Family Medicine

## 2015-03-22 ENCOUNTER — Encounter: Payer: Self-pay | Admitting: Family Medicine

## 2015-03-22 VITALS — BP 110/50 | HR 70 | Wt 196.0 lb

## 2015-03-22 DIAGNOSIS — G4700 Insomnia, unspecified: Secondary | ICD-10-CM | POA: Diagnosis not present

## 2015-03-22 DIAGNOSIS — M7631 Iliotibial band syndrome, right leg: Secondary | ICD-10-CM

## 2015-03-22 DIAGNOSIS — S76311D Strain of muscle, fascia and tendon of the posterior muscle group at thigh level, right thigh, subsequent encounter: Secondary | ICD-10-CM

## 2015-03-22 MED ORDER — TRAZODONE HCL 50 MG PO TABS: 25.0000 mg | ORAL_TABLET | Freq: Every evening | ORAL | Status: DC | PRN

## 2015-03-22 NOTE — Assessment & Plan Note (Signed)
Resolved

## 2015-03-22 NOTE — Patient Instructions (Signed)
Thank you for coming in today. Wean off ambien.  Start trazodone for sleep as needed.  Return as needed.   Insomnia Insomnia is a sleep disorder that makes it difficult to fall asleep or to stay asleep. Insomnia can cause tiredness (fatigue), low energy, difficulty concentrating, mood swings, and poor performance at work or school.  There are three different ways to classify insomnia:  Difficulty falling asleep.  Difficulty staying asleep.  Waking up too early in the morning. Any type of insomnia can be long-term (chronic) or short-term (acute). Both are common. Short-term insomnia usually lasts for three months or less. Chronic insomnia occurs at least three times a week for longer than three months. CAUSES  Insomnia may be caused by another condition, situation, or substance, such as:  Anxiety.  Certain medicines.  Gastroesophageal reflux disease (GERD) or other gastrointestinal conditions.  Asthma or other breathing conditions.  Restless legs syndrome, sleep apnea, or other sleep disorders.  Chronic pain.  Menopause. This may include hot flashes.  Stroke.  Abuse of alcohol, tobacco, or illegal drugs.  Depression.  Caffeine.   Neurological disorders, such as Alzheimer disease.  An overactive thyroid (hyperthyroidism). The cause of insomnia may not be known. RISK FACTORS Risk factors for insomnia include:  Gender. Women are more commonly affected than men.  Age. Insomnia is more common as you get older.  Stress. This may involve your professional or personal life.  Income. Insomnia is more common in people with lower income.  Lack of exercise.   Irregular work schedule or night shifts.  Traveling between different time zones. SIGNS AND SYMPTOMS If you have insomnia, trouble falling asleep or trouble staying asleep is the main symptom. This may lead to other symptoms, such as:  Feeling fatigued.  Feeling nervous about going to sleep.  Not feeling  rested in the morning.  Having trouble concentrating.  Feeling irritable, anxious, or depressed. TREATMENT  Treatment for insomnia depends on the cause. If your insomnia is caused by an underlying condition, treatment will focus on addressing the condition. Treatment may also include:   Medicines to help you sleep.  Counseling or therapy.  Lifestyle adjustments. HOME CARE INSTRUCTIONS   Take medicines only as directed by your health care provider.  Keep regular sleeping and waking hours. Avoid naps.  Keep a sleep diary to help you and your health care provider figure out what could be causing your insomnia. Include:   When you sleep.  When you wake up during the night.  How well you sleep.   How rested you feel the next day.  Any side effects of medicines you are taking.  What you eat and drink.   Make your bedroom a comfortable place where it is easy to fall asleep:  Put up shades or special blackout curtains to block light from outside.  Use a white noise machine to block noise.  Keep the temperature cool.   Exercise regularly as directed by your health care provider. Avoid exercising right before bedtime.  Use relaxation techniques to manage stress. Ask your health care provider to suggest some techniques that may work well for you. These may include:  Breathing exercises.  Routines to release muscle tension.  Visualizing peaceful scenes.  Cut back on alcohol, caffeinated beverages, and cigarettes, especially close to bedtime. These can disrupt your sleep.  Do not overeat or eat spicy foods right before bedtime. This can lead to digestive discomfort that can make it hard for you to sleep.  Limit  screen use before bedtime. This includes:  Watching TV.  Using your smartphone, tablet, and computer.  Stick to a routine. This can help you fall asleep faster. Try to do a quiet activity, brush your teeth, and go to bed at the same time each night.  Get  out of bed if you are still awake after 15 minutes of trying to sleep. Keep the lights down, but try reading or doing a quiet activity. When you feel sleepy, go back to bed.  Make sure that you drive carefully. Avoid driving if you feel very sleepy.  Keep all follow-up appointments as directed by your health care provider. This is important. SEEK MEDICAL CARE IF:   You are tired throughout the day or have trouble in your daily routine due to sleepiness.  You continue to have sleep problems or your sleep problems get worse. SEEK IMMEDIATE MEDICAL CARE IF:   You have serious thoughts about hurting yourself or someone else.   This information is not intended to replace advice given to you by your health care provider. Make sure you discuss any questions you have with your health care provider.   Document Released: 05/22/2000 Document Revised: 02/13/2015 Document Reviewed: 02/23/2014 Elsevier Interactive Patient Education [employer]2016 [employer]

## 2015-03-22 NOTE — Progress Notes (Signed)
Jeff Rogers is a 24 y.o. male who [name]  today for a follow-up appointment on his sleeping medication. Jeff Rogers states that he has been sleeping well when he uses the Ambien but he has been experiencing some dry mouth which he states lasts for several hours after he awakes. He also states that he would like to stop taking any medication for sleep at some point.   Additionally, he states that his right leg is feeling great and has no complaints at this time.    No past medical history on file. No past surgical history on file. Social History  Substance Use Topics  . Smoking status: Never Smoker   . Smokeless tobacco: Not on file  . Alcohol Use: No   Family history is unknown by patient.  ROS as above Medications: Current Outpatient Prescriptions  Medication Sig Dispense Refill  . zolpidem (AMBIEN) 10 MG tablet Take 1 tablet (10 mg total) by mouth at bedtime as needed for sleep. 30 tablet 5  . traZODone (DESYREL) 50 MG tablet Take 0.5-1 tablets (25-50 mg total) by mouth at bedtime as needed for sleep. 30 tablet 3   No current facility-administered medications for this visit.   No Known Allergies   Exam:  BP 110/50 mmHg  Pulse 70  Wt 196 lb (88.905 kg) Gen: WDWN male in no acute distress.  HEENT: EOMI,  MMM Lungs: Normal work of breathing. CTABL Heart: RRR no MRG Abd: NABS, Soft. Nondistended, Nontender Exts: Brisk capillary refill, warm and well perfused.   Assessment: 1. Insomnia based on patient history and previous diagnoses.   Plan: 1. Prescribed Trazodone 50 mg tablet PO PRN for sleep. Discussed use of Trazadone in place of Ambien and attempting to stop using Ambien on most nights for sleep. Risks and side effects discussed with patient. Patient conveyed understanding. Please see discharge instructions.

## 2015-03-22 NOTE — Assessment & Plan Note (Signed)
Wean off of Ambien and onto trazodone. Plan to eventually wean off of all medications. Discussed sleep hygiene at the last visit.

## 2015-03-29 ENCOUNTER — Ambulatory Visit: Payer: Managed Care, Other (non HMO) | Admitting: Family Medicine

## 2015-07-03 ENCOUNTER — Ambulatory Visit (INDEPENDENT_AMBULATORY_CARE_PROVIDER_SITE_OTHER): Payer: Managed Care, Other (non HMO)

## 2015-07-03 ENCOUNTER — Ambulatory Visit (INDEPENDENT_AMBULATORY_CARE_PROVIDER_SITE_OTHER): Payer: Managed Care, Other (non HMO) | Admitting: Family Medicine

## 2015-07-03 ENCOUNTER — Encounter: Payer: Self-pay | Admitting: Family Medicine

## 2015-07-03 VITALS — BP 124/71 | HR 64 | Wt 192.0 lb

## 2015-07-03 DIAGNOSIS — M545 Low back pain, unspecified: Secondary | ICD-10-CM

## 2015-07-03 DIAGNOSIS — M25562 Pain in left knee: Secondary | ICD-10-CM | POA: Diagnosis not present

## 2015-07-03 DIAGNOSIS — M25561 Pain in right knee: Secondary | ICD-10-CM | POA: Diagnosis not present

## 2015-07-03 DIAGNOSIS — M79604 Pain in right leg: Secondary | ICD-10-CM | POA: Diagnosis not present

## 2015-07-03 MED ORDER — MELOXICAM 15 MG PO TABS: 15.0000 mg | ORAL_TABLET | Freq: Every day | ORAL | Status: DC

## 2015-07-03 NOTE — Assessment & Plan Note (Signed)
Back pain possibly spondylolisthesis. Awaiting radiology review. Plan for meloxicam and PT. Recheck in 4 weeks.

## 2015-07-03 NOTE — Progress Notes (Addendum)
Jeff Rogers is a 25 y.o. male who presents to [facility] today for right leg pain and back pain. Patient has been seen several months ago for posterior knee pain. This was thought to be related to distal IT band or hamstring. He had treatment with hamstring exercises as well as exercises for distal IT band. This helped some but his symptoms continue. He notes pain especially in his right posterior lateral knee radiating to his buttocks and into his back. This is worse with activity such as walking and squatting bending jumping and running. The pain is becoming somewhat limiting to his activities. He denies any bowel or bladder dysfunction numbness fevers or chills. He does note some mild subjective left leg weakness as well. He denies any difficulty climbing or squatting or moving. He states the weakness feels as though his legs isn't as strong as it should be.   No past medical history on file. No past surgical history on file. Social History  Substance Use Topics  . Smoking status: Never Smoker   . Smokeless tobacco: Not on file  . Alcohol Use: No   Family history is unknown by patient.  ROS:  No headache, visual changes, nausea, vomiting, diarrhea, constipation, dizziness, abdominal pain, skin rash, fevers, chills, night sweats, weight loss, swollen lymph nodes, body aches, joint swelling, muscle aches, chest pain, shortness of breath, mood changes, visual or auditory hallucinations.    Medications: Current Outpatient Prescriptions  Medication Sig Dispense Refill  . traZODone (DESYREL) 50 MG tablet Take 0.5-1 tablets (25-50 mg total) by mouth at bedtime as needed for sleep. 30 tablet 3  . zolpidem (AMBIEN) 10 MG tablet Take 1 tablet (10 mg total) by mouth at bedtime as needed for sleep. 30 tablet 5  . meloxicam (MOBIC) 15 MG tablet Take 1 tablet (15 mg total) by mouth daily. 30 tablet 1   No current facility-administered medications for  this visit.   No Known Allergies   Exam:  BP 124/71 mmHg  Pulse 64  Wt 192 lb (87.091 kg) General: Well Developed, well nourished, and in no acute distress.  Neuro/Psych: Alert and oriented x3, extra-ocular muscles intact, able to move all 4 extremities, sensation grossly intact. Skin: Warm and dry, no rashes noted.  Respiratory: Not using accessory muscles, speaking in full sentences, trachea midline.  Cardiovascular: Pulses palpable, no extremity edema. Abdomen: Does not appear distended. MSK: Back: Nontender to spinal midline. Tender to palpation right SI joint. Normal range of motion. Pain with extension. Positive right-sided stork test. Lower extremity strength is equal and normal throughout. Lower extremity sensation and reflexes are equal and normal throughout. Knee normal-appearing without effusion.     No results found for this or any previous visit (from the past 24 hour(s)). Dg Lumbar Spine Complete  07/03/2015  CLINICAL DATA:  Right lower back pain for the past 4-5 months without known injury. EXAM: LUMBAR SPINE - COMPLETE 4+ VIEW COMPARISON:  None in PACs FINDINGS: The lumbar vertebral bodies are preserved in height. The disc space heights are well maintained. There is no spondylolisthesis. No pars defects are observed. The pedicles and transverse processes are intact. The observed portions of the sacrum are normal. IMPRESSION: There is no acute or chronic bony abnormality of the lumbar spine. Electronically Signed   By: David  [place] M.D.   On: 07/03/2015 11:53   Dg Knee 1-2 Views Left  07/03/2015  CLINICAL DATA:  Right knee pain for 4-5 months. No known  injury. Left knee for comparison. EXAM: LEFT KNEE - 1-2 VIEW COMPARISON:  Right knee performed today. FINDINGS: Single AP view of the left knee is unremarkable. Joint spaces are maintained. No fracture, subluxation or dislocation. IMPRESSION: Negative. Electronically Signed   By: Charlett Nose M.D.   On: 07/03/2015 11:57     Dg Knee Complete 4 Views Right  07/03/2015  CLINICAL DATA:  Right knee pain for 4-5 months.  No known injury. EXAM: RIGHT KNEE - COMPLETE 4+ VIEW COMPARISON:  None. FINDINGS: There is no evidence of fracture, dislocation, or joint effusion. There is no evidence of arthropathy or other focal bone abnormality. Soft tissues are unremarkable. AP view of the left knee is also submitted which is unremarkable. IMPRESSION: Negative. Electronically Signed   By: Charlett Nose M.D.   On: 07/03/2015 11:56     Please see individual assessment and plan sections.   Addendum: No pars defects are spondylolisthesis seen by radiology. Continue current plan.

## 2015-07-03 NOTE — Progress Notes (Signed)
Quick Note:  Xray of lumbar spine is normal. No changes like I was worried about. Continue PT. ______

## 2015-07-03 NOTE — Patient Instructions (Signed)
Thank you for coming in today. Take meloxicam daily for pain. Attend physical therapy. Return in 4 weeks. Come back or go to the emergency room if you notice new weakness new numbness problems walking or bowel or bladder problems.  Spondylolisthesis With Rehab The slipping of one or multiple vertebrae out of the correct anatomical position is a condition known as spondylolisthesis. Spondylolisthesis is most common in adolescents and is caused by a number of different reasons, such as vertebral fracture or something you are born with (congenital). Spondylolisthesis is diagnosed with the use of X-rays. SYMPTOMS   Dull, achy pain in the lower back.  Pain that worsens with extension of the spine.  Tightness of the muscles on the back of the thigh.  Lower back stiffness.  Signs of nerve damage: pain, numbness, or weakness affecting one or both lower extremities.  Muscle wasting (atrophy), uncommon.  Loss of stool (bowel) or urine (bladder) function. CAUSES  The symptoms of spondylolisthesis are caused by one or more vertebrae that are out of alignment, placing pressure on the spinal cord. Common mechanisms of injury include:  Congenital defect of the spine.  Degenerative process.  Stress fracture of the spine.  Fracture due to trauma to the spine. RISK INCREASES WITH:  Activities that have a risk of hyperextending the back.  Activities that have a risk of excessively rotating the spine.  Poor strength and flexibility.  Failure to warm up properly before activity.  Family history of spondylolysis or spondylolisthesis.  Improper sports technique. PREVENTION  Warm up and stretch properly before activity.  Allow for adequate recovery between workouts.  Maintain physical fitness:  Strength, flexibility, and endurance.  Cardiovascular fitness.  Learn and use proper technique. When possible, have a coach correct improper technique. PROGNOSIS  If treated properly, the  spondylolisthesis usually resolves. RELATED COMPLICATIONS   Recurrent symptoms that result in a chronic problem.  Inability to compete in athletics.  Prolonged healing time, if improperly treated or reinjured.  Failure of the fracture to heal (nonunion).  Healing of the fracture in a poor position (malunion). TREATMENT Treatment initially involves resting from any activities that aggravate the symptoms and the use of ice and medications to help reduce pain and inflammation. The use of strengthening and stretching exercises may help reduce pain with activity. These exercises may be performed at home or with referral to a therapist. It is important to learn how to use proper body mechanics as to not place undue stress on your spine. If the injury is severe, then your caregiver may recommend a back brace to allow for healing, or even surgery. Surgery often involves fusing two adjacent vertebrae so no movement is allowed between them.  MEDICATION   If pain medication is necessary, then nonsteroidal anti-inflammatory medications, such as aspirin and ibuprofen, or other minor pain relievers, such as acetaminophen, are often recommended.  Do not take pain medication for 7 days before surgery.  Prescription pain relievers may be given if deemed necessary by your caregiver. Use only as directed and only as much as you need. HEAT AND COLD  Cold treatment (icing) relieves pain and reduces inflammation. Cold treatment should be applied for 10 to 15 minutes every 2 to 3 hours for inflammation and pain and immediately after any activity that aggravates your symptoms. Use ice packs or massage the area with a piece of ice (ice massage).  Heat treatment may be used prior to performing the stretching and strengthening activities prescribed by your caregiver, physical therapist,  or Event organiser. Use a heat pack or soak the injury in warm water. SEEK MEDICAL CARE IF:  Treatment seems to offer no benefit,  or the condition worsens.  Any medications produce adverse side effects.  Any complications from surgery occur:  Pain, numbness, or coldness in the extremity operated upon.  Discoloration of the nail beds (they become blue or gray) of the extremity operated upon.  Signs of infections (fever, pain, inflammation, redness, or persistent bleeding). EXERCISES RANGE OF MOTION (ROM) AND STRETCHING EXERCISES - Spondylolisthesis Most people with low back pain will find that their symptoms worsen with either excessive bending forward (flexion) or arching at the low back (extension). The exercises which will help resolve your symptoms will focus on the opposite motion. Your physician, physical therapist or athletic trainer will help you determine which exercises will be most helpful to resolve your low back pain. Do not complete any exercises without first consulting with your clinician. Discontinue any exercises which worsen your symptoms until you speak to your clinician. If you have pain, numbness or tingling which travels down into your buttocks, leg, or foot, the goal of the therapy is for these symptoms to move closer to your back and eventually resolve. Occasionally, these leg symptoms will get better, but your low back pain may worsen; this is typically an indication of progress in your rehabilitation. Be certain to be very alert to any changes in your symptoms and the activities in which you participated in the 24 hours prior to the change. Sharing this information with your clinician will allow him/her to most efficiently treat your condition. These exercises may help you when beginning to rehabilitate your injury. Your symptoms may resolve with or without further involvement from your physician, physical therapist or athletic trainer. While completing these exercises, remember:   Restoring tissue flexibility helps normal motion to return to the joints. This allows healthier, less painful movement and  activity.  An effective stretch should be held for at least 30 seconds.  A stretch should never be painful. You should only feel a gentle lengthening or release in the stretched tissue. FLEXION RANGE OF MOTION AND STRETCHING EXERCISES: STRETCH - Flexion, Single Knee to Chest  Lie on a firm bed or floor with both legs extended in front of you.  Keeping one leg in contact with the floor, bring your opposite knee to your chest. Hold your leg in place by either grabbing behind your thigh or at your knee.  Pull until you feel a gentle stretch in your low back. Hold __________ seconds. Slowly release your grasp and repeat the exercise with the opposite side. Repeat __________ times. Complete this exercise __________ times per day.  STRETCH - Flexion, Double Knee to Chest  Lie on a firm bed or floor with both legs extended in front of you.  Keeping one leg in contact with the floor, bring your opposite knee to your chest.  Tense your stomach muscles to support your back and then lift your other knee to your chest. Hold your legs in place by either grabbing behind your thighs or at your knees.  Pull both knees toward your chest until you feel a gentle stretch in your low back. Hold __________ seconds.  Tense your stomach muscles and slowly return one leg at a time to the floor. Repeat __________ times. Complete this exercise __________ times per day.  STRENGTHENING EXERCISES - Spondylolisthesis These exercises may help you when beginning to rehabilitate your injury. These exercises should be  done near your "sweet spot." This is the neutral, low-back arch, somewhere between fully rounded and fully arched, that is your least painful position. When performed in this safe range of motion, these exercises can be used for people who have either a flexion or extension based injury. These exercises may resolve your symptoms with or without further involvement from your physician, physical therapist or  athletic trainer. While completing these exercises, remember:   Muscles can gain both the endurance and the strength needed for everyday activities through controlled exercises.  Complete these exercises as instructed by your physician, physical therapist or athletic trainer. Progress the resistance and repetitions only as guided.  You may experience muscle soreness or fatigue, but the pain or discomfort you are trying to eliminate should never worsen during these exercises. If this pain does worsen, stop and make certain you are following the directions exactly. If the pain is still present after adjustments, discontinue the exercise until you can discuss the trouble with your clinician. STRENGTHENING - Deep Abdominals, Pelvic Tilt   Lie on a firm bed or floor. Keeping your legs in front of you, bend your knees so they are both pointed toward the ceiling and your feet are flat on the floor.  Tense your lower abdominal muscles to press your low back into the floor. This motion will rotate your pelvis so that your tail bone is scooping upwards rather than pointing at your feet or into the floor.  With a gentle tension and even breathing, hold this position for __________ seconds. Repeat __________ times. Complete this exercise __________ times per day.  STRENGTHENING - Abdominals, Crunches   Lie on a firm bed or floor. Keeping your legs in front of you, bend your knees so they are both pointed toward the ceiling and your feet are flat on the floor. Cross your arms over your chest.  Slightly tip your chin down without bending your neck.  Tense your abdominals and slowly lift your trunk high enough to just clear your shoulder blades. Lifting higher can put excessive stress on the low back and does not further strengthen your abdominal muscles.  Control your return to the starting position. Repeat __________ times. Complete this exercise __________ times per day.  STRENGTHENING - Quadruped,  Opposite UE/LE Lift   Assume a hands and knees position on a firm surface. Keep your hands under your shoulders and your knees under your hips. You may place padding under your knees for comfort.  Find your neutral spine and gently tense your abdominal muscles so that you can maintain this position. Your shoulders and hips should form a rectangle that is parallel with the floor and is not twisted.  Keeping your trunk steady, lift your right hand no higher than your shoulder and then your left leg no higher than your hip. Make sure you are not holding your breath. Hold this position __________ seconds.  Continuing to keep your abdominal muscles tense and your back steady, slowly return to your starting position. Repeat with the opposite arm and leg. Repeat __________ times. Complete this exercise __________ times per day.  STRENGTHENING - Lower Abdominals, Double Knee Lift  Lie on a firm bed or floor. Keeping your legs in front of you, bend your knees so they are both pointed toward the ceiling and your feet are flat on the floor.  Tense your abdominal muscles to brace your low back and slowly lift both of your knees until they come over your hips. Be certain  not to hold your breath.  Hold __________ seconds. Using your abdominal muscles, return to the starting position in a slow and controlled manner. Repeat __________ times. Complete this exercise __________ times per day.  POSTURE AND BODY MECHANICS CONSIDERATIONS - Spondylolisthesis Keeping correct posture when sitting, standing or completing your activities will reduce the stress put on different body tissues, allowing injured tissues a chance to heal and limiting painful experiences. The following are general guidelines for improved posture. Your physician or physical therapist will provide you with any instructions specific to your needs. While reading these guidelines, remember:  The exercises prescribed by your provider will help you  have the flexibility and strength to maintain correct postures.  The correct posture provides the optimal environment for your joints to work. All of your joints have less wear and tear when properly supported by a spine with good posture. This means you will experience a healthier, less painful body.  Correct posture must be practiced with all of your activities, especially prolonged sitting and standing. Correct posture is as important when doing repetitive low-stress activities (typing) as it is when doing a single heavy-load activity (lifting). PROPER SITTING POSTURE In order to minimize stress and discomfort on your spine, you must sit with correct posture. Sitting with good posture should be effortless for a healthy body. Returning to good posture is a gradual process. Many people can work toward this most comfortably by using various supports until they have the flexibility and strength to maintain this posture on their own. When sitting with proper posture, your ears will fall over your shoulders and your shoulders will fall over your hips. You should use the back of the chair to support your upper back. Your low back will be in a neutral position, just slightly arched. You may place a small pillow or folded towel at the base of your low back for  support.  When working at a desk, create an environment that supports good, upright posture. Without extra support, muscles fatigue and lead to excessive strain on joints and other tissues. Keep these recommendations in mind: CHAIR:  A chair should be able to slide under your desk when your back makes contact with the back of the chair. This allows you to work closely.  The chair's height should allow your eyes to be level with the upper part of your monitor and your hands to be slightly lower than your elbows. BODY POSITION  Your feet should make contact with the floor. If this is not possible, use a foot rest.  Keep your ears over your  shoulders. This will reduce stress on your neck and low back. INCORRECT SITTING POSTURES If you are feeling tired and unable to assume a healthy sitting posture, do not slouch or slump. This puts excessive strain on your back tissues, causing more damage and pain. Healthier options include:  Using more support, like a lumbar pillow.  Switching tasks to something that requires you to be upright or walking.  Taking a brief walk.  Lying down to rest in a neutral-spine position.  CORRECT LIFTING TECHNIQUES DO:   Assume a wide stance. This will provide you more stability and the opportunity to get as close as possible to the object which you are lifting.  Tense your abdominals to brace your spine; then bend at the knees and hips. Keeping your back locked in a neutral-spine position, lift using your leg muscles. Lift with your legs, keeping your back straight.  Test the weight  of unknown objects before attempting to lift them.  Try to keep your elbows locked down at your sides in order get the best strength from your shoulders when carrying an object.  Always ask for help when lifting heavy or awkward objects. INCORRECT LIFTING TECHNIQUES DO NOT:   Lock your knees when lifting, even if it is a small object.  Bend and twist. Pivot at your feet or move your feet when needing to change directions.  Assume that you cannot safely pick up a paperclip without proper posture.   This information is not intended to replace advice given to you by your health care provider. Make sure you discuss any questions you have with your health care provider.   Document Released: 05/25/2005 Document Revised: 02/13/2015 Document Reviewed: 09/06/2008 [employer] Interactive Patient Education [employer]

## 2015-07-03 NOTE — Assessment & Plan Note (Signed)
Possibly radicular versus IT band. Plan for physical therapy.

## 2015-07-08 ENCOUNTER — Ambulatory Visit (INDEPENDENT_AMBULATORY_CARE_PROVIDER_SITE_OTHER): Payer: Managed Care, Other (non HMO) | Admitting: Rehabilitative and Restorative Service Providers"

## 2015-07-08 DIAGNOSIS — Z7409 Other reduced mobility: Secondary | ICD-10-CM | POA: Diagnosis not present

## 2015-07-08 DIAGNOSIS — M6289 Other specified disorders of muscle: Secondary | ICD-10-CM

## 2015-07-08 DIAGNOSIS — R29898 Other symptoms and signs involving the musculoskeletal system: Secondary | ICD-10-CM

## 2015-07-08 DIAGNOSIS — M629 Disorder of muscle, unspecified: Secondary | ICD-10-CM | POA: Diagnosis not present

## 2015-07-08 DIAGNOSIS — R6889 Other general symptoms and signs: Secondary | ICD-10-CM

## 2015-07-08 DIAGNOSIS — M5416 Radiculopathy, lumbar region: Secondary | ICD-10-CM | POA: Diagnosis not present

## 2015-07-08 DIAGNOSIS — R531 Weakness: Secondary | ICD-10-CM

## 2015-07-08 NOTE — Patient Instructions (Signed)
Trunk: Prone Extension (Press-Ups)    Lie on stomach on firm, flat surface. Relax bottom and legs. Raise chest in air with elbows straight. Keep hips flat on surface, sag stomach. Hold __2-3__ seconds. Repeat __10__ times. Do _several___ sessions per day. CAUTION: Movement should be gentle and slow.  Trunk Extension    Standing, place back of open hands on low back. Straighten spine then arch the back and move shoulders back. Pause 1-2 sec Repeat __2-3__ times per session. Do __several__ sessions per day Variation: Seated    Sleeping on Back  Place pillow under knees. A pillow with cervical support and a roll around waist are also helpful. Copyright  [employer]. All rights reserved.  Sleeping on Side Place pillow between knees. Use cervical support under neck and a roll around waist as needed. Copyright  [employer]. All rights reserved.   Sleeping on Stomach   If this is the only desirable sleeping position, place pillow under lower legs, and under stomach or chest as needed.  Posture - Sitting   Sit upright, head facing forward. Try using a roll to support lower back. Keep shoulders relaxed, and avoid rounded back. Keep hips level with knees. Avoid crossing legs for long periods. Stand to Sit / Sit to Stand   To sit: Bend knees to lower self onto front edge of chair, then scoot back on seat. To stand: Reverse sequence by placing one foot forward, and scoot to front of seat. Use rocking motion to stand up.   Work Height and Reach  Ideal work height is no more than 2 to 4 inches below elbow level when standing, and at elbow level when sitting. Reaching should be limited to arm's length, with elbows slightly bent.  Bending  Bend at hips and knees, not back. Keep feet shoulder-width apart.    Posture - Standing   Good posture is important. Avoid slouching and forward head thrust. Maintain curve in low back and align ears over shoul- ders, hips over ankles.  Alternating  Positions   Alternate tasks and change positions frequently to reduce fatigue and muscle tension. Take rest breaks. Computer Work   Position work to Art gallery manager. Use proper work and seat height. Keep shoulders back and down, wrists straight, and elbows at right angles. Use chair that provides full back support. Add footrest and lumbar roll as needed.  Getting Into / Out of Car  Lower self onto seat, scoot back, then bring in one leg at a time. Reverse sequence to get out.  Dressing  Lie on back to pull socks or slacks over feet, or sit and bend leg while keeping back straight.    Housework - Sink  Place one foot on ledge of cabinet under sink when standing at sink for prolonged periods.   Pushing / Pulling  Pushing is preferable to pulling. Keep back in proper alignment, and use leg muscles to do the work.  Deep Squat   Squat and lift with both arms held against upper trunk. Tighten stomach muscles without holding breath. Use smooth movements to avoid jerking.  Avoid Twisting   Avoid twisting or bending back. Pivot around using foot movements, and bend at knees if needed when reaching for articles.  Carrying Luggage   Distribute weight evenly on both sides. Use a cart whenever possible. Do not twist trunk. Move body as a unit.   Lifting Principles .Maintain proper posture and head alignment. .Slide object as close as possible before lifting. .Move obstacles out of  the way. .Test before lifting; ask for help if too heavy. .Tighten stomach muscles without holding breath. .Use smooth movements; do not jerk. .Use legs to do the work, and pivot with feet. .Distribute the work load symmetrically and close to the center of trunk. .Push instead of pull whenever possible.   Ask For Help   Ask for help and delegate to others when possible. Coordinate your movements when lifting together, and maintain the low back curve.  Log Roll   Lying on back, bend left knee  and place left arm across chest. Roll all in one movement to the right. Reverse to roll to the left. Always move as one unit. Housework - Sweeping  Use long-handled equipment to avoid stooping.   Housework - Wiping  Position yourself as close as possible to reach work surface. Avoid straining your back.  Laundry - Unloading Wash   To unload small items at bottom of washer, lift leg opposite to arm being used to reach.  Gardening - Raking  Move close to area to be raked. Use arm movements to do the work. Keep back straight and avoid twisting.     Cart  When reaching into cart with one arm, lift opposite leg to keep back straight.   Getting Into / Out of Bed  Lower self to lie down on one side by raising legs and lowering head at the same time. Use arms to assist moving without twisting. Bend both knees to roll onto back if desired. To sit up, start from lying on side, and use same move-ments in reverse. Housework - Vacuuming  Hold the vacuum with arm held at side. Step back and forth to move it, keeping head up. Avoid twisting.   Laundry - Armed forces training and education officer so that bending and twisting can be avoided.   Laundry - Unloading Dryer  Squat down to reach into clothes dryer or use a reacher.  Gardening - Weeding / Psychiatric nurse or Kneel. Knee pads may be helpful.

## 2015-07-08 NOTE — Therapy (Signed)
[facility] [place] [address] [place] [address] [place], [place], [address] Phone: [phone]   Fax:  [phone]  Physical Therapy Evaluation  Patient Details  Name: Jeff Rogers MRN: [id number] Date of Birth: 1990-10-27 Referring Provider: Denyse Amass  Encounter Date: 07/08/2015      PT End of Session - 07/08/15 1654    Visit Number 1   Number of Visits 12   Date for PT Re-Evaluation 08/19/15   PT Start Time 1432  patient 32 min late for appt    PT Stop Time 1504   PT Time Calculation (min) 32 min   Activity Tolerance Patient tolerated treatment well      No past medical history on file.  No past surgical history on file.  There were no vitals filed for this visit.  Visit Diagnosis:  Right lumbar radiculopathy - Plan: PT plan of care cert/re-cert  Tightness of fascia of lower extremity - Plan: PT plan of care cert/re-cert  Weakness of right hip - Plan: PT plan of care cert/re-cert  Decreased strength, endurance, and mobility - Plan: PT plan of care cert/re-cert      Subjective Assessment - 07/08/15 1436    Subjective Patient reports that he started experiencing a "nerve" pain with jumping and moving about a year ago. He was seen by MD and treatd with stretching exercises for the Rt LE. Symptoms resolved with the stretching but returned ~ 4 months ago. He now has pain in the "tail bone' area. Pain in his Rt leg begins when he finishes his job. The back pain begins with lifting.    Pertinent History Denies any other medical/musculoskeletal problems    How long can you sit comfortably? 30-40 min    How long can you stand comfortably? 3-4 hours   How long can you walk comfortably? 1 hour    Diagnostic tests xrays (-)    Patient Stated Goals get rid of the pain in his leg and back    Currently in Pain? Yes   Pain Score 5    Pain Location Leg   Pain Orientation Right   Pain Descriptors / Indicators Sharp   Pain Type Acute pain   Pain  Radiating Towards radiates toward LB with LBP sharp at 7/10    Pain Onset More than a month ago   Pain Frequency Intermittent   Aggravating Factors  leg - jumping and running; standing moving knee in or out feels pain in the thigh. LBP with lifing and bending    Pain Relieving Factors rest; ice            [facility] PT Assessment - 07/08/15 0001    Assessment   Medical Diagnosis Acute Rt leg pain; Rt sided LBP    Referring Provider Denyse Amass   Onset Date/Surgical Date 04/09/15   Hand Dominance Right   Next MD Visit 08/05/15   Prior Therapy none   Precautions   Precautions None   Balance Screen   Has the patient fallen in the past 6 months Yes   How many times? 2   Has the patient had a decrease in activity level because of a fear of falling?  No   Is the patient reluctant to leave their home because of a fear of falling?  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; lifting to 30-60# ~ 300 times/hour placing item on a lift ~2-3 ft twisting body   Leisure yard work; gym 4 days/wk - cardio 30-45 min; free weights    Observation/Other Assessments   Focus on Therapeutic Outcomes (FOTO)  54% limitation    Sensation   Additional Comments WFL's per pt report    Posture/Postural Control   Posture Comments sits with rounded lumbar spine either slumpped in chair or fwd elbows on thighs; stands with rounded shoulders   AROM   Lumbar Flexion 90%   pain LB into Rt LE increased with reps   Lumbar Extension 60%  no pain or LE symptoms    Lumbar - Right Side Bend 85%  pain/symptom free   Lumbar - Left Side Bend 80%  no symptoms   Lumbar - Right Rotation 45%  no symptoms   Lumbar - Left Rotation 40%  pain in Rt LB    Strength   Overall Strength Comments 5/5 except Rt hip ext  4/5   Flexibility   Hamstrings tight bilat 75 deg Rt; 79 deg Lt    Quadriceps mild tight bilat    ITB tight Rt > Lt    Piriformis tight Rt > Lt    Palpation   Spinal mobility pain with CPA mobs L5/4/3/2; UPA Rt L5/4    Palpation comment tight Rt QL/lats/glut med/max/piriformis/lateral hamstrings   Special Tests    Special Tests Lumbar   Lumbar Tests --  rep fwd flexion LBP Rt LE pain; figure 4 Rt LE pain                    [facility] Adult PT Treatment/Exercise - 07/08/15 0001    Therapeutic Activites    Therapeutic Activities --  education re lifting positions/no twisting    Neuro Re-ed    Neuro Re-ed Details  working on posture and alignment in standing    Lumbar Exercises: Stretches   Standing Extension 3 reps  2-3 sec hold   Prone on Elbows Stretch 1 rep;60 seconds   Press Ups --  10 reps 2-3 sec hold                 PT Education - 07/08/15 1515    Education provided Yes   Education Details posture; lifting; body mechanics; HEP; discussed TDN   Person(s) Educated Patient   Methods Explanation;Demonstration;Tactile cues;Verbal cues;Handout   Comprehension Verbalized understanding;Returned demonstration;Verbal cues required;Tactile cues required             PT Long Term Goals - 07/08/15 1718    PT LONG TERM GOAL #1   Title Improve understanding of body mechanics and spine care with pt to verbalize and demo correct lifting techniques 08/19/15   Time 6   Period Weeks   Status New   PT LONG TERM GOAL #2   Title 5/5 strength Rt hip ext 08/19/15   Time 6   Period Weeks   Status New   PT LONG TERM GOAL #3   Title Instruct in appropriate gym program with safe strengthening exercises 08/19/15   Time 6   Period Weeks   PT LONG TERM GOAL #4   Title I in HEP for spine care 08/19/15   Time 6   Period Weeks   Status New   PT LONG TERM GOAL #5   Title Improve FOTO to </= 35% limitation 08/19/15   Time 6   Period Weeks  Status New                Plan - 07/08/15 1712    Clinical Impression Statement Patient presents with LBP and Rt LE radicular pain. He has job that requires repetitive foward flexed and twisted posture throughout his work day. Pt also works out in [employer] performing dead lifts and other free weight activities that likley irritate the lumbar spine. He has signs and symptoms consistent with lumbar radiculopathy and Rt LEweakness;  myofacial pain and tightness. Pt has limited turnk and LE mobility and tightness with palpation. He will benefit form PT to address problems identified.    Pt will benefit from skilled therapeutic intervention in order to improve on the following deficits Improper body mechanics;Postural dysfunction;Decreased range of motion;Decreased mobility;Decreased strength;Increased fascial restricitons;Decreased activity tolerance   Rehab Potential Good   PT Frequency 2x / week   PT Duration 6 weeks   PT Treatment/Interventions Patient/family education;ADLs/Self Care Home Management;Therapeutic exercise;Therapeutic activities;Neuromuscular re-education;Manual techniques;Dry needling;Cryotherapy;Electrical Stimulation;Moist Heat;Ultrasound   PT Next Visit Plan Stretching; extension program L-spine; mobilization L-spine; possible trial of TDN as schedule allows   PT Home Exercise Plan extension; body mechanics    Consulted and Agree with Plan of Care Patient         Problem List Patient Active Problem List   Diagnosis Date Noted  . Right leg pain 07/03/2015  . Lumbago 07/03/2015    Marialena Wollen Rober Minion PT, MPH  07/08/2015, 5:23 PM  [facility] [address] [place] [address] [place], [place], [address] Phone: [phone]   Fax:  [phone]  Name: Jeff Rogers MRN: [id number] Date of Birth: 1990-11-02

## 2015-07-11 ENCOUNTER — Ambulatory Visit (INDEPENDENT_AMBULATORY_CARE_PROVIDER_SITE_OTHER): Payer: Managed Care, Other (non HMO) | Admitting: Physical Therapy

## 2015-07-11 DIAGNOSIS — M629 Disorder of muscle, unspecified: Secondary | ICD-10-CM

## 2015-07-11 DIAGNOSIS — R6889 Other general symptoms and signs: Secondary | ICD-10-CM

## 2015-07-11 DIAGNOSIS — M6289 Other specified disorders of muscle: Secondary | ICD-10-CM

## 2015-07-11 DIAGNOSIS — M5416 Radiculopathy, lumbar region: Secondary | ICD-10-CM | POA: Diagnosis not present

## 2015-07-11 DIAGNOSIS — Z7409 Other reduced mobility: Secondary | ICD-10-CM

## 2015-07-11 DIAGNOSIS — R531 Weakness: Secondary | ICD-10-CM

## 2015-07-11 DIAGNOSIS — R29898 Other symptoms and signs involving the musculoskeletal system: Secondary | ICD-10-CM

## 2015-07-11 NOTE — Therapy (Signed)
[place], [place], [address] Phone: [phone]   Fax:  [phone]  Physical Therapy Treatment  Patient Details  Name: Jeff Rogers MRN: [id number] Date of Birth: [birth year] Referring Provider: Georgina Snell  Encounter Date: 07/11/2015      PT End of Session - 07/11/15 1706    Visit Number 2   Number of Visits 12   Date for PT Re-Evaluation 08/19/15   PT Start Time 2620   PT Stop Time 3559   PT Time Calculation (min) 49 min   Activity Tolerance Patient tolerated treatment well      No past medical history on file.  No past surgical history on file.  There were no vitals filed for this visit.  Visit Diagnosis:  Right lumbar radiculopathy  Tightness of fascia of lower extremity  Weakness of right hip  Decreased strength, endurance, and mobility      Subjective Assessment - 07/11/15 1712    Subjective Pt states he is doing his HEP, still has pain in his legback of Rt knee, like a muscle pull and more pain into his back.    Currently in Pain? Yes   Pain Score 4    Pain Location Leg   Pain Orientation Right;Mid   Pain Descriptors / Indicators Tightness   Multiple Pain Sites Yes   Pain Score 9   Pain Location Back   Pain Orientation Right   Pain Descriptors / Indicators Throbbing   Aggravating Factors  with bending and lifting   Pain Relieving Factors walking                         [facility] Adult PT Treatment/Exercise - 07/11/15 0001    Lumbar Exercises: Stretches   Double Knee to Chest Stretch 30 seconds   Lower Trunk Rotation 2 reps;20 seconds  each side   Modalities   Modalities Electrical Stimulation;Moist Heat   Moist Heat Therapy   Number Minutes Moist Heat 10 Minutes   Moist Heat Location Lumbar Spine  Rt QL and lower thoracic   Electrical Stimulation   Electrical Stimulation Location T10-T8 with needling   Electrical Stimulation Action HV   Electrical  Stimulation Parameters to tolerance   Electrical Stimulation Goals Pain;Tone   Manual Therapy   Manual Therapy Soft tissue mobilization   Soft tissue mobilization bilat lumbar and thoracic paraspinals Rt QL, gluts and HS.          Trigger Point Dry Needling - 07/11/15 1711    Consent Given? Yes   Education Handout Provided Yes   Muscles Treated Upper Body Quadratus Lumborum  Rt   Muscles Treated Lower Body --  longisimus/multifidi T 10 - T8, hooked to stim              PT Education - 07/11/15 1748    Education provided Yes   Education Details TDN   Person(s) Educated Patient   Methods Explanation;Handout   Comprehension Verbalized understanding             PT Long Term Goals - 07/11/15 1749    PT LONG TERM GOAL #1   Title Improve understanding of body mechanics and spine care with pt to verbalize and demo correct lifting techniques 08/19/15   Status On-going   PT LONG TERM GOAL #2   Title 5/5 strength Rt hip ext 08/19/15   Status On-going   PT LONG TERM GOAL #3   Title Instruct in  appropriate gym program with safe strengthening exercises 08/19/15   Status On-going   PT LONG TERM GOAL #4   Title I in HEP for spine care 08/19/15   Status On-going   PT LONG TERM GOAL #5   Title Improve FOTO to </= 35% limitation 08/19/15   Status On-going               Plan - 07/11/15 1748    Clinical Impression Statement Pt had a great response to TDN and manual therapy to his back.  He reports the back feels a lot more loose.  No goals met as it is only second visit.    Pt will benefit from skilled therapeutic intervention in order to improve on the following deficits Improper body mechanics;Postural dysfunction;Decreased range of motion;Decreased mobility;Decreased strength;Increased fascial restricitons;Decreased activity tolerance   Rehab Potential Good   PT Frequency 2x / week   PT Duration 6 weeks   PT Treatment/Interventions Patient/family education;ADLs/Self  Care Home Management;Therapeutic exercise;Therapeutic activities;Neuromuscular re-education;Manual techniques;Dry needling;Cryotherapy;Electrical Stimulation;Moist Heat;Ultrasound   PT Next Visit Plan assess response long term to TDN        Problem List Patient Active Problem List   Diagnosis Date Noted  . Right leg pain 07/03/2015  . Lumbago 07/03/2015    Jeral Pinch PT 07/11/2015, 5:52 PM  [facility] [place], [place], [address] Phone: [phone]   Fax:  [phone]  Name: Jeff Rogers MRN: [id number] Date of Birth: 1991-03-03

## 2015-07-11 NOTE — Patient Instructions (Signed)

## 2015-07-18 ENCOUNTER — Ambulatory Visit (INDEPENDENT_AMBULATORY_CARE_PROVIDER_SITE_OTHER): Payer: Managed Care, Other (non HMO) | Admitting: Physical Therapy

## 2015-07-18 ENCOUNTER — Encounter: Payer: Self-pay | Admitting: Physical Therapy

## 2015-07-18 ENCOUNTER — Encounter: Payer: Managed Care, Other (non HMO) | Admitting: Physical Therapy

## 2015-07-18 DIAGNOSIS — R531 Weakness: Secondary | ICD-10-CM

## 2015-07-18 DIAGNOSIS — R29898 Other symptoms and signs involving the musculoskeletal system: Secondary | ICD-10-CM | POA: Diagnosis not present

## 2015-07-18 DIAGNOSIS — Z7409 Other reduced mobility: Secondary | ICD-10-CM | POA: Diagnosis not present

## 2015-07-18 DIAGNOSIS — R6889 Other general symptoms and signs: Secondary | ICD-10-CM

## 2015-07-18 DIAGNOSIS — M5416 Radiculopathy, lumbar region: Secondary | ICD-10-CM | POA: Diagnosis not present

## 2015-07-18 DIAGNOSIS — M6289 Other specified disorders of muscle: Secondary | ICD-10-CM

## 2015-07-18 DIAGNOSIS — M629 Disorder of muscle, unspecified: Secondary | ICD-10-CM

## 2015-07-18 NOTE — Therapy (Signed)
[place], [place], [address] Phone: [phone]   Fax:  [phone]  Physical Therapy Treatment  Patient Details  Name: Jeff Rogers MRN: [id number] Date of Birth: 02/29/1991 Referring Provider: Georgina Snell  Encounter Date: 07/18/2015      PT End of Session - 07/18/15 1445    Visit Number 3   Number of Visits 12   Date for PT Re-Evaluation 08/19/15   PT Start Time 3845   PT Stop Time 3646   PT Time Calculation (min) 69 min   Activity Tolerance Patient tolerated treatment well      History reviewed. No pertinent past medical history.  History reviewed. No pertinent past surgical history.  There were no vitals filed for this visit.  Visit Diagnosis:  Right lumbar radiculopathy  Tightness of fascia of lower extremity  Weakness of right hip  Decreased strength, endurance, and mobility      Subjective Assessment - 07/18/15 1542    Subjective Pt states his mid back feels better, the pain is pretty much out of the Rt LE and now only in the low back   Currently in Pain? Yes   Pain Score 4    Pain Location Back   Pain Orientation Left;Right   Pain Type Acute pain                         [facility] Adult PT Treatment/Exercise - 07/18/15 0001    Lumbar Exercises: Stretches   Double Knee to Chest Stretch 30 seconds   Lower Trunk Rotation --  10reps   ITB Stretch 3 reps;30 seconds  with strap cross body   Lumbar Exercises: Aerobic   UBE (Upper Arm Bike) standing on BOSU L3x4' alt BWD FWD   Lumbar Exercises: Prone   Other Prone Lumbar Exercises pelvic press series, Lt stronger than the Rt. stopped after prone hip ext, muscles fatigued.    Modalities   Modalities Electrical Stimulation;Moist Heat   Moist Heat Therapy   Number Minutes Moist Heat 15 Minutes   Moist Heat Location Lumbar Spine   Electrical Stimulation   Electrical Stimulation Location lumbar paraspinals with  needles   Electrical Stimulation Action HV   Electrical Stimulation Parameters to tolerance   Electrical Stimulation Goals Pain;Tone   Manual Therapy   Manual Therapy Soft tissue mobilization   Soft tissue mobilization bilat lumbar and thoracic paraspinals Rt QL, gluts and HS.          Trigger Point Dry Needling - 07/18/15 1533    Consent Given? Yes   Education Handout Provided No   Muscles Treated Upper Body Longissimus  lumbar multifidi   Longissimus Response Twitch response elicited;Palpable increased muscle length              PT Education - 07/18/15 1500    Education provided Yes   Education Details HEP progression   Person(s) Educated Patient   Methods Explanation;Demonstration;Handout   Comprehension Returned demonstration;Verbalized understanding             PT Long Term Goals - 07/18/15 1512    PT LONG TERM GOAL #1   Title Improve understanding of body mechanics and spine care with pt to verbalize and demo correct lifting techniques 08/19/15   Status Achieved   PT LONG TERM GOAL #2   Status Achieved  Rt 5/5, Lt 4+/5   PT LONG TERM GOAL #3   Title Instruct in appropriate gym program with  safe strengthening exercises 08/19/15   Status On-going   PT LONG TERM GOAL #4   Title I in HEP for spine care 08/19/15   Status On-going   PT LONG TERM GOAL #5   Title Improve FOTO to </= 35% limitation 08/19/15   Status On-going               Plan - 07/18/15 1556    Clinical Impression Statement Overall Jeff Rogers's pain is decreasing, he is slowly adding in harder/heavier lifting at work.  He still has tightness in his paraspinals and his Rt multifidi fatigues with ther ex. He has met some of his goals and progressing tothe others   Pt will benefit from skilled therapeutic intervention in order to improve on the following deficits Improper body mechanics;Postural dysfunction;Decreased range of motion;Decreased mobility;Decreased strength;Increased fascial  restricitons;Decreased activity tolerance   Rehab Potential Good   PT Frequency 2x / week   PT Duration 6 weeks   PT Treatment/Interventions Patient/family education;ADLs/Self Care Home Management;Therapeutic exercise;Therapeutic activities;Neuromuscular re-education;Manual techniques;Dry needling;Cryotherapy;Electrical Stimulation;Moist Heat;Ultrasound   PT Next Visit Plan review pelvic press, add abdominal ther ex and manual therapy PRN to back musculature.    Consulted and Agree with Plan of Care Patient        Problem List Patient Active Problem List   Diagnosis Date Noted  . Right leg pain 07/03/2015  . Lumbago 07/03/2015    Jeral Pinch PT 07/18/2015, 3:59 PM  [facility] [place], [place], [address] Phone: [phone]   Fax:  [phone]  Name: Jeff Rogers MRN: [id number] Date of Birth: 02-24-1991

## 2015-07-18 NOTE — Patient Instructions (Signed)
Outer Hip Stretch: Reclined IT Band Stretch (Strap)     [place] [phone]    Strap around opposite foot, pull across only as far as possible with shoulders on mat. Hold for __30__ secs. Repeat _1-2___ times each leg.   Adductor Stretch: Reclined (Strap, Wall)    Warm up with leg vertical. Rotate leg to side and fix foot to wall. Anchor opposite hip. Hold for _10-20___ secs. Repeat __2-3__ times each leg.  Pelvic Press     Place hands under belly between navel and pubic bone, palms up. Feel pressure on hands. Increase pressure on hands by pressing pelvis down. This is NOT a pelvic tilt. Hold __5_ seconds. Relax. Repeat _10__ times. Once a day.  KNEE: Flexion - Prone   Hold pelvic press. Bend knee, then return the foot down. Repeat on opposite leg. Do not raise hips. _10__ reps per set. When this is mastered, pull both heels up at same time, x 10 reps.  Once a day   Leg Lift: One-Leg   Press pelvis down. Keep knee straight; lengthen and lift one leg (from waist). Do not twist body. Keep other leg down. Hold _1__ seconds. Relax. Repeat 10 time. Repeat with other leg.  HIP: Extension / KNEE: Flexion - Prone    Hold pelvic press. Bend knee, squeeze glutes. Raise leg up  10___ reps per set, _1__ sets per day, _1__ time a day.   Axial Extension- Upper body sequence * always start with pelvic press    Lie on stomach with forehead resting on floor and arms at sides. Tuck chin in and raise head from floor without bending it up or down. Repeat ___10_ times per set. Do __1__ sets per session. Do _1___ sessions per day.  Progression:  Arms at side Arms in T shape Arms in W shape  Arms in M shape Arms in Y shape  [facility] at [facility] [place] [address] [place], [place] [address]  [phone] (office) [phone] (fax)

## 2015-07-24 ENCOUNTER — Ambulatory Visit (INDEPENDENT_AMBULATORY_CARE_PROVIDER_SITE_OTHER): Payer: Managed Care, Other (non HMO) | Admitting: Physical Therapy

## 2015-07-24 ENCOUNTER — Encounter: Payer: Self-pay | Admitting: Physical Therapy

## 2015-07-24 DIAGNOSIS — M629 Disorder of muscle, unspecified: Secondary | ICD-10-CM | POA: Diagnosis not present

## 2015-07-24 DIAGNOSIS — Z7409 Other reduced mobility: Secondary | ICD-10-CM

## 2015-07-24 DIAGNOSIS — R6889 Other general symptoms and signs: Secondary | ICD-10-CM

## 2015-07-24 DIAGNOSIS — M6289 Other specified disorders of muscle: Secondary | ICD-10-CM

## 2015-07-24 DIAGNOSIS — M5416 Radiculopathy, lumbar region: Secondary | ICD-10-CM | POA: Diagnosis not present

## 2015-07-24 DIAGNOSIS — R29898 Other symptoms and signs involving the musculoskeletal system: Secondary | ICD-10-CM

## 2015-07-24 DIAGNOSIS — R531 Weakness: Secondary | ICD-10-CM

## 2015-07-24 NOTE — Therapy (Signed)
[facility] [place] [address] [place] [address] [facility], [place], [address] Phone: [phone]   Fax:  [phone]  Physical Therapy Treatment  Patient Details  Name: Jeff Rogers MRN: [id number] Date of Birth: 1990-12-01 Referring Provider: Denyse Amass  Encounter Date: 07/24/2015      PT End of Session - 07/24/15 1409    Visit Number 4   Number of Visits 12   Date for PT Re-Evaluation 08/19/15   PT Start Time 1405   PT Stop Time 1458   PT Time Calculation (min) 53 min   Activity Tolerance Patient tolerated treatment well      History reviewed. No pertinent past medical history.  History reviewed. No pertinent past surgical history.  There were no vitals filed for this visit.  Visit Diagnosis:  Right lumbar radiculopathy  Tightness of fascia of lower extremity  Weakness of right hip  Decreased strength, endurance, and mobility      Subjective Assessment - 07/24/15 1407    Subjective No pain today had low back pain on Monday, none since. He has not returned to the gym yet. He is nervous about that.    Currently in Pain? No/denies                         [facility] Adult PT Treatment/Exercise - 07/24/15 0001    Lumbar Exercises: Stretches   ITB Stretch 3 reps;30 seconds  with strap cross body   Lumbar Exercises: Aerobic   Stationary Bike L3x5'   Lumbar Exercises: Machines for Strengthening   Cybex Knee Extension 3 plates, 10 reps   Leg Press 6 plates x [id number]   Other Lumbar Machine Exercise heel raises on leg press,6 plates, 25DGUY   Other Lumbar Machine Exercise lat pull downs, seated row,    Lumbar Exercises: Supine   Other Supine Lumbar Exercises pilates, table tops heel taps, knee to chest, leg circles.    Lumbar Exercises: Quadruped   Opposite Arm/Leg Raise 10 reps;Left arm/Right leg;Right arm/Left leg  VC for form   Modalities   Modalities Electrical Stimulation;Moist Heat   Moist Heat Therapy   Number  Minutes Moist Heat 15 Minutes   Moist Heat Location Lumbar Spine   Electrical Stimulation   Electrical Stimulation Location lumbar paraspinals with needles   Electrical Stimulation Action IFC   Electrical Stimulation Parameters to tolerance   Electrical Stimulation Goals Pain;Tone                PT Education - 07/24/15 1433    Education provided Yes   Education Details HEP progression   Person(s) Educated Patient   Methods Explanation;Demonstration;Handout   Comprehension Verbalized understanding;Returned demonstration             PT Long Term Goals - 07/24/15 1409    PT LONG TERM GOAL #1   Title Improve understanding of body mechanics and spine care with pt to verbalize and demo correct lifting techniques 08/19/15   Status Achieved   PT LONG TERM GOAL #2   Title 5/5 strength Rt hip ext 08/19/15   Status Achieved   PT LONG TERM GOAL #3   Title Instruct in appropriate gym program with safe strengthening exercises 08/19/15   Status On-going   PT LONG TERM GOAL #4   Title I in HEP for spine care 08/19/15   Status On-going   PT LONG TERM GOAL #5   Title Improve FOTO to </= 35% limitation 08/19/15   Status On-going  Plan - 07/24/15 1445    Clinical Impression Statement Jeff Rogers has had a good week. Progressing well with return to activity and also having pain decrease.    Pt will benefit from skilled therapeutic intervention in order to improve on the following deficits Improper body mechanics;Postural dysfunction;Decreased range of motion;Decreased mobility;Decreased strength;Increased fascial restricitons;Decreased activity tolerance   Rehab Potential Good   PT Frequency 2x / week   PT Duration 6 weeks   PT Treatment/Interventions Patient/family education;ADLs/Self Care Home Management;Therapeutic exercise;Therapeutic activities;Neuromuscular re-education;Manual techniques;Dry needling;Cryotherapy;Electrical Stimulation;Moist Heat;Ultrasound   PT Next  Visit Plan if still doing well d/c to HEP   Consulted and Agree with Plan of Care Patient        Problem List Patient Active Problem List   Diagnosis Date Noted  . Right leg pain 07/03/2015  . Lumbago 07/03/2015    Roderic Scarce PT  07/24/2015, 3:37 PM  [facility] [address] [place] [address] [place], [place], [address] Phone: [phone]   Fax:  [phone]  Name: Jeff Rogers MRN: [id number] Date of Birth: 06/16/1990

## 2015-07-24 NOTE — Patient Instructions (Signed)
Knee Fold    Lie on back, legs bent, arms by sides. Exhale, lifting knee to chest. Inhale, returning. Keep abdominals flat, navel to spine. Repeat _2-3x10___ times, alternating legs. Do __1__ sessions per day.   Toe Touch    Lie on back, legs folded to chest, arms by sides. Exhale, lowering leg to just touch toes to mat. Inhale, returning knee to chest. Keep abdominals flat, navel to spine. Repeat _2-3x10___ times, alternating legs. Do __1__ sessions per day.   Single Leg Circle    Lie on back, one leg bent, other leg straight up. Inhale, circling leg across body, and exhale while circling down and around to beginning. Maintain still pelvis; avoid rocking. Keep circle small. Repeat _2-3x10___ times clockwise, then counterclockwise. Repeat with other leg. Do __10__ sessions per day.   Lower Lift don't start until single leg is easy.     Lie on back, legs straight up, slightly turned out, hands under hips. Exhale, slowly lowering legs a few degrees. Inhale, returning. Press heels together. Knees may be slightly bent, leaving quads released. Repeat _10___ times. Do _1___ sessions per day. NOTE: Keep navel to spine, back flat.  Single Leg Stretch    Lie on back, opposite hand holding knee to chest, other hand on same shin, other leg at 45. Exhale, curling up head and upper torso. Holding curl, inhale and change leg and hand positions. Exhale, changing back. Repeat __2x10__ changes in double time: 2 per inhale, 2 per exhale. NOTE: Keep navel to spine, back flat.    Upper / Lower Extremity Extension (All-Fours)    Tighten stomach and raise right leg and opposite arm. Keep trunk rigid. Repeat 10____ times per set. Do __1-3__ sets per session. Do __1__ sessions per day.

## 2015-07-29 ENCOUNTER — Ambulatory Visit (INDEPENDENT_AMBULATORY_CARE_PROVIDER_SITE_OTHER): Payer: Managed Care, Other (non HMO) | Admitting: Family Medicine

## 2015-07-29 ENCOUNTER — Encounter: Payer: Self-pay | Admitting: Family Medicine

## 2015-07-29 VITALS — BP 140/74 | HR 80 | Wt 197.0 lb

## 2015-07-29 DIAGNOSIS — M79604 Pain in right leg: Secondary | ICD-10-CM | POA: Diagnosis not present

## 2015-07-29 NOTE — Progress Notes (Signed)
       Jeff Rogers is a 25 y.o. male who presents to [facility]: Primary Care today for follow-up right leg pain. Patient was then seen recently for back pain and right pain thought to be related to IT band. He's had considerable improvement of his back pain with physical therapy including dry needling. His leg pain is still mildly persistent. He return to work full duty and notes worsening leg pain. Symptoms are mild to moderate and worse with work activities.   No past medical history on file. No past surgical history on file. Social History  Substance Use Topics  . Smoking status: Never Smoker   . Smokeless tobacco: Not on file  . Alcohol Use: No   Family history is unknown by patient.  ROS as above Medications: Current Outpatient Prescriptions  Medication Sig Dispense Refill  . meloxicam (MOBIC) 15 MG tablet Take 1 tablet (15 mg total) by mouth daily. 30 tablet 1  . traZODone (DESYREL) 50 MG tablet Take 0.5-1 tablets (25-50 mg total) by mouth at bedtime as needed for sleep. 30 tablet 3  . zolpidem (AMBIEN) 10 MG tablet Take 1 tablet (10 mg total) by mouth at bedtime as needed for sleep. 30 tablet 5   No current facility-administered medications for this visit.   No Known Allergies   Exam:  BP 140/74 mmHg  Pulse 80  Wt 197 lb (89.359 kg) Gen: Well NAD Right leg: Mildly tender palpation greater trochanter and distal IT band. Pain worse with stretching of the IT band. Normal gait.   No results found for this or any previous visit (from the past 24 hour(s)). No results found.   Please see individual assessment and plan sections.

## 2015-07-29 NOTE — Patient Instructions (Signed)
Thank you for coming in today. Return in 1 month.  Continue PT.

## 2015-07-29 NOTE — Assessment & Plan Note (Signed)
Doing well but mildly persistent. Continue physical therapy. Consider iontophoresis. Recheck 1 month.

## 2015-07-30 ENCOUNTER — Encounter: Payer: Self-pay | Admitting: Physical Therapy

## 2015-08-01 ENCOUNTER — Ambulatory Visit (INDEPENDENT_AMBULATORY_CARE_PROVIDER_SITE_OTHER): Payer: Managed Care, Other (non HMO) | Admitting: Physical Therapy

## 2015-08-01 ENCOUNTER — Encounter: Payer: Self-pay | Admitting: Physical Therapy

## 2015-08-01 DIAGNOSIS — R29898 Other symptoms and signs involving the musculoskeletal system: Secondary | ICD-10-CM

## 2015-08-01 DIAGNOSIS — R531 Weakness: Secondary | ICD-10-CM

## 2015-08-01 DIAGNOSIS — M5416 Radiculopathy, lumbar region: Secondary | ICD-10-CM | POA: Diagnosis not present

## 2015-08-01 DIAGNOSIS — Z7409 Other reduced mobility: Secondary | ICD-10-CM | POA: Diagnosis not present

## 2015-08-01 DIAGNOSIS — M6289 Other specified disorders of muscle: Secondary | ICD-10-CM

## 2015-08-01 DIAGNOSIS — M629 Disorder of muscle, unspecified: Secondary | ICD-10-CM

## 2015-08-01 NOTE — Therapy (Signed)
[facility] [place] [address] [place], [place], [address] Phone: [phone]   Fax:  [phone]  Physical Therapy Treatment  Patient Details  Name: Nicky Kras MRN: [id number] Date of Birth: February 26, 1991 Referring Provider: Denyse Amass  Encounter Date: 08/01/2015      PT End of Session - 08/01/15 1455    Visit Number 5   Number of Visits 12   Date for PT Re-Evaluation 08/19/15   PT Start Time 1451   PT Stop Time 1535   PT Time Calculation (min) 44 min   Activity Tolerance Patient tolerated treatment well      History reviewed. No pertinent past medical history.  History reviewed. No pertinent past surgical history.  There were no vitals filed for this visit.  Visit Diagnosis:  Right lumbar radiculopathy  Tightness of fascia of lower extremity  Weakness of right hip  Decreased strength, endurance, and mobility      Subjective Assessment - 08/01/15 1453    Subjective Back pain is all gone, had bad pain yesterday while working, today it's not too bad,   Currently in Pain? Yes   Pain Score 3    Pain Location Leg   Pain Orientation Right;Lateral  proximal and distal ITB   Pain Descriptors / Indicators Sharp   Pain Type Acute pain   Pain Frequency Intermittent   Aggravating Factors  working and cutting motions   Pain Relieving Factors rest                         [facility] Adult PT Treatment/Exercise - 08/01/15 0001    Lumbar Exercises: Standing   Other Standing Lumbar Exercises Rt hip abduction green band no pain with straight plane mov't, pain with slight ext and slight flexion.    Lumbar Exercises: Sidelying   Clam 20 reps  Rt, then 10 reverse clams.    Other Sidelying Lumbar Exercises attempted stretch Rt hip abductors behing and had pain    Modalities   Modalities Ultrasound;Iontophoresis   Ultrasound   Ultrasound Location distal Rt ITB and proximal Rt ITB 4' each static  Changed proximal [place]  to 50%, 1.20mHz, 1.0w/cm2   Ultrasound Parameters 20%, 0.80w/cm2, 3.66mhZ    Ultrasound Goals Pain;Edema   Iontophoresis   Type of Iontophoresis Dexamethasone   Location 1: distal Rt ITB, 2: proximal posterior greater troch   Dose 1.0cc each   Time 6 hr patch   Manual Therapy   Soft tissue mobilization pt tender to palpation in Rt hip posterior greater toch. and distal ITB attachment, some trigger points in ITB          Trigger Point Dry Needling - 08/01/15 1536    Consent Given? Yes   Education Handout Provided No   Muscles Treated Upper Body Piriformis  Rt                   PT Long Term Goals - 08/01/15 1455    PT LONG TERM GOAL #1   Title Improve understanding of body mechanics and spine care with pt to verbalize and demo correct lifting techniques 08/19/15   Status Achieved   PT LONG TERM GOAL #2   Title 5/5 strength Rt hip ext 08/19/15   Status Achieved   PT LONG TERM GOAL #3   Title Instruct in appropriate gym program with safe strengthening exercises 08/19/15   Status Achieved   PT LONG TERM GOAL #4   Title I in  HEP for spine care 08/19/15   Status On-going   PT LONG TERM GOAL #5   Title Improve FOTO to </= 35% limitation 08/19/15   Status On-going               Plan - 08/01/15 1541    Clinical Impression Statement Firmin is very happy that his back pain is gone however he is limited by the lateral Rt thigh pain. He does have some point tenderness proximal and distal ITB along with piriformis tightness.    Pt will benefit from skilled therapeutic intervention in order to improve on the following deficits Improper body mechanics;Postural dysfunction;Decreased range of motion;Decreased mobility;Decreased strength;Increased fascial restricitons;Decreased activity tolerance   Rehab Potential Good   PT Frequency 2x / week   PT Duration 6 weeks   PT Treatment/Interventions Patient/family education;ADLs/Self Care Home Management;Therapeutic  exercise;Therapeutic activities;Neuromuscular re-education;Manual techniques;Dry needling;Cryotherapy;Electrical Stimulation;Moist Heat;Ultrasound   PT Next Visit Plan Pt is going to try and come 2x.wk for ionto ( per Md recommendations) to see if this settles down the rest of his symptoms.    Consulted and Agree with Plan of Care Patient        Problem List Patient Active Problem List   Diagnosis Date Noted  . Right leg pain 07/03/2015  . Lumbago 07/03/2015    Roderic Scarce PT 08/01/2015, 3:58 PM  [facility] [address] [place], [place], [address] Phone: [phone]   Fax:  [phone]  Name: Olyn Landstrom MRN: [id number] Date of Birth: 1990/11/01

## 2015-08-05 ENCOUNTER — Ambulatory Visit: Payer: Managed Care, Other (non HMO) | Admitting: Family Medicine

## 2015-08-06 ENCOUNTER — Encounter: Payer: Managed Care, Other (non HMO) | Admitting: Physical Therapy

## 2015-08-08 ENCOUNTER — Ambulatory Visit (INDEPENDENT_AMBULATORY_CARE_PROVIDER_SITE_OTHER): Payer: Managed Care, Other (non HMO) | Admitting: Physical Therapy

## 2015-08-08 DIAGNOSIS — R531 Weakness: Secondary | ICD-10-CM

## 2015-08-08 DIAGNOSIS — Z7409 Other reduced mobility: Secondary | ICD-10-CM | POA: Diagnosis not present

## 2015-08-08 DIAGNOSIS — R29898 Other symptoms and signs involving the musculoskeletal system: Secondary | ICD-10-CM | POA: Diagnosis not present

## 2015-08-08 DIAGNOSIS — M629 Disorder of muscle, unspecified: Secondary | ICD-10-CM

## 2015-08-08 DIAGNOSIS — M6289 Other specified disorders of muscle: Secondary | ICD-10-CM

## 2015-08-08 DIAGNOSIS — M5416 Radiculopathy, lumbar region: Secondary | ICD-10-CM | POA: Diagnosis not present

## 2015-08-08 NOTE — Therapy (Signed)
[facility] [place] [address] [place], [place], [address] Phone: [phone]   Fax:  [phone]  Physical Therapy Treatment  Patient Details  Name: Jeff Rogers MRN: [id number] Date of Birth: [birth year] Referring Provider: Dr. Denyse Amass   Encounter Date: 08/08/2015      PT End of Session - 08/08/15 1546    Visit Number 6   Number of Visits 12   Date for PT Re-Evaluation 08/19/15   PT Start Time 1545  pt arrived late   PT Stop Time 1646   PT Time Calculation (min) 61 min   Activity Tolerance Patient tolerated treatment well      No past medical history on file.  No past surgical history on file.  There were no vitals filed for this visit.  Visit Diagnosis:  Right lumbar radiculopathy  Tightness of fascia of lower extremity  Weakness of right hip  Decreased strength, endurance, and mobility      Subjective Assessment - 08/08/15 1548    Subjective Pt reports that his Rt leg pain is gone as well as back pain.  Has not had any issues since last visit.  Pt feels ionto treatment was beneficial.    Currently in Pain? No/denies            [facility] PT Assessment - 08/08/15 0001    Assessment   Medical Diagnosis Acute Rt leg pain; Rt sided LBP    Referring Provider Dr. Denyse Amass    Onset Date/Surgical Date 04/09/15   Hand Dominance Right   Next MD Visit 08/05/15   AROM   Lumbar Flexion WNL   Lumbar Extension WNL   Lumbar - Right Side Bend WNL   Lumbar - Left Side Bend WNL   Lumbar - Right Rotation WNL   Lumbar - Left Rotation WNL   Strength   Overall Strength Comments 5/5 except Rt hip ext 4+/5                     [facility] Adult PT Treatment/Exercise - 08/08/15 0001    Lumbar Exercises: Stretches   ITB Stretch 3 reps;30 seconds  Standing - each side   Lumbar Exercises: Aerobic   Elliptical L4: 5 min    Lumbar Exercises: Machines for Strengthening   Cybex Knee Extension 3 plates, 10 reps  - with  mostly RLE controlling return to flexion. BLE each way with 4 plates x 10    Leg Press 8 plates x 10 reps, 10 plates x 10 reps VC to slow pace.     Lumbar Exercises: Standing   Other Standing Lumbar Exercises Leg swings x 10 each front / side to side.    Other Standing Lumbar Exercises Standing front lunge with small spinal twist x 8 reps each side.    Lumbar Exercises: Supine   Other Supine Lumbar Exercises pilates table top heel taps x 10 reps x 2 sets.    Lumbar Exercises: Prone   Straight Leg Raise 20 reps  RLE only   Other Prone Lumbar Exercises childs pose with / without lateral trunk flexion x 5 breaths each position   Lumbar Exercises: Quadruped   Opposite Arm/Leg Raise 5 seconds;5 reps;Right arm/Left leg;Left arm/Right leg   Opposite Arm/Leg Raise Limitations unable to sustain RLE with UE, switched to LE only.  Some pain in Rt lateral hip.    Modalities   Modalities Iontophoresis;Ultrasound   Ultrasound   Ultrasound Location distal Rt ITB    Ultrasound Parameters  20%, 0.8  w/cm2, 1.0 mHz, static 8 min   Ultrasound Goals Edema;Pain   Iontophoresis   Type of Iontophoresis Dexamethasone   Location distal Rt ITB   Dose 1.0 cc patch   Time 6 hr    Manual Therapy   Manual Therapy Soft tissue mobilization   Soft tissue mobilization Edge tool assistance to Rt lateral quad, hamstring and distal ITB to decrease adhesions and pain.                 PT Education - 08/08/15 1652    Education provided Yes   Education Details Added leg swings (dynamic stretches prior to running) to front and side.     Person(s) Educated Patient   Methods Explanation;Demonstration   Comprehension Verbalized understanding;Returned demonstration             PT Long Term Goals - 08/01/15 1455    PT LONG TERM GOAL #1   Title Improve understanding of body mechanics and spine care with pt to verbalize and demo correct lifting techniques 08/19/15   Status Achieved   PT LONG TERM GOAL #2    Title 5/5 strength Rt hip ext 08/19/15   Status Achieved   PT LONG TERM GOAL #3   Title Instruct in appropriate gym program with safe strengthening exercises 08/19/15   Status Achieved   PT LONG TERM GOAL #4   Title I in HEP for spine care 08/19/15   Status On-going   PT LONG TERM GOAL #5   Title Improve FOTO to </= 35% limitation 08/19/15   Status On-going               Plan - 08/08/15 1649    Clinical Impression Statement Pt had mild pain in Rt ITB (distal portion) with exercises; reduced with US/ manual therapy.  Pt demonstrated some weakness in RLE compared to Lt.  2nd treatment of ionto given at distal ITB where pt continues to be point tender.  Progressing towards remaining goals.    Pt will benefit from skilled therapeutic intervention in order to improve on the following deficits Improper body mechanics;Postural dysfunction;Decreased range of motion;Decreased mobility;Decreased strength;Increased fascial restricitons;Decreased activity tolerance   Rehab Potential Good   PT Frequency 2x / week   PT Duration 6 weeks   PT Treatment/Interventions Patient/family education;ADLs/Self Care Home Management;Therapeutic exercise;Therapeutic activities;Neuromuscular re-education;Manual techniques;Dry needling;Cryotherapy;Electrical Stimulation;Moist Heat;Ultrasound   PT Next Visit Plan Assess response to ionto and added speed of tasks at work.  Continue progressive core and RLE strengthening.    Consulted and Agree with Plan of Care Patient        Problem List Patient Active Problem List   Diagnosis Date Noted  . Right leg pain 07/03/2015  . Lumbago 07/03/2015   Mayer Camel, PTA 08/08/2015 5:47 PM  [facility] [place] [address] [place], [place], [address] Phone: [phone]   Fax:  [phone]  Name: Jeff Rogers MRN: [id number] Date of Birth: 10-Oct-1990

## 2015-08-15 ENCOUNTER — Ambulatory Visit (INDEPENDENT_AMBULATORY_CARE_PROVIDER_SITE_OTHER): Payer: Managed Care, Other (non HMO) | Admitting: Physical Therapy

## 2015-08-15 ENCOUNTER — Encounter: Payer: Self-pay | Admitting: Physical Therapy

## 2015-08-15 DIAGNOSIS — Z7409 Other reduced mobility: Secondary | ICD-10-CM

## 2015-08-15 DIAGNOSIS — R29898 Other symptoms and signs involving the musculoskeletal system: Secondary | ICD-10-CM

## 2015-08-15 DIAGNOSIS — M5416 Radiculopathy, lumbar region: Secondary | ICD-10-CM | POA: Diagnosis not present

## 2015-08-15 DIAGNOSIS — M6289 Other specified disorders of muscle: Secondary | ICD-10-CM

## 2015-08-15 DIAGNOSIS — R531 Weakness: Secondary | ICD-10-CM

## 2015-08-15 DIAGNOSIS — M629 Disorder of muscle, unspecified: Secondary | ICD-10-CM

## 2015-08-15 NOTE — Therapy (Signed)
[place], [place], [address] Phone: [phone]   Fax:  [phone]  Physical Therapy Treatment  Patient Details  Name: Jeff Rogers MRN: [id number] Date of Birth: 01/07/1991 Referring Provider: Dr. Georgina Snell   Encounter Date: 08/15/2015      PT End of Session - 08/15/15 1703    Visit Number 7   Number of Visits 12   Date for PT Re-Evaluation 08/19/15   PT Start Time 1700   PT Stop Time 1744   PT Time Calculation (min) 44 min   Activity Tolerance Patient tolerated treatment well;No increased pain      History reviewed. No pertinent past medical history.  History reviewed. No pertinent past surgical history.  There were no vitals filed for this visit.  Visit Diagnosis:  Right lumbar radiculopathy  Tightness of fascia of lower extremity  Weakness of right hip  Decreased strength, endurance, and mobility      Subjective Assessment - 08/15/15 1701    Subjective Jeff Rogers states he is back to full duty at work, running some and walking with work, gets about 8-10 miles a day.  Doesn't have back pain, however reports back stiffness about 4/10   Patient Stated Goals get rid of the pain in his leg and back    Currently in Pain? No/denies  only stiffness             [facility] PT Assessment - 08/15/15 0001    Assessment   Medical Diagnosis Acute Rt leg pain; Rt sided LBP    Referring Provider Dr. Georgina Snell    Onset Date/Surgical Date 04/09/15   Hand Dominance Right   Next MD Visit 08/26/15            [facility] Adult PT Treatment/Exercise - 08/15/15 0001    Lumbar Exercises: Stretches   Passive Hamstring Stretch 2 reps;30 seconds   Double Knee to Chest Stretch 30 seconds   Lower Trunk Rotation 2 reps;30 seconds  each side with arm and leg extended.    ITB Stretch 2 reps;30 seconds  supine with strap   ITB Stretch Limitations followed by adductor stretch with strap    Lumbar Exercises: Aerobic   Elliptical L4: 5 min    Lumbar Exercises: Standing   Side Lunge --  3x10 with upper body rotation, blue ball.    Other Standing Lumbar Exercises Leg swings x 10 each front / side to side.    Lumbar Exercises: Supine   Bridge --  8 reps, 10 sec holds, feet on ball   Lumbar Exercises: Prone   Other Prone Lumbar Exercises fitter, 2 blue bands, plank arms side/side   Lumbar Exercises: Quadruped   Plank 15 sec (high plank); then high plank with knee touch to mat x 8 reps x 2 sets (very challenging) required mirror for visual feeback   Modalities   Modalities --  pt declined. No pain.            PT Long Term Goals - 08/15/15 1704    PT LONG TERM GOAL #1   Title Improve understanding of body mechanics and spine care with pt to verbalize and demo correct lifting techniques 08/19/15   Status Achieved   PT LONG TERM GOAL #2   Title 5/5 strength Rt hip ext 08/19/15   Status Achieved   PT LONG TERM GOAL #3   Title Instruct in appropriate gym program with safe strengthening exercises 08/19/15   PT LONG TERM GOAL #4  Title I in [place] for spine care 08/19/15   Status On-going   PT LONG TERM GOAL #5   Title Improve FOTO to </= 35% limitation 08/19/15   Status On-going               Plan - 08/15/15 1704    Clinical Impression Statement Jeff Rogers is progressing well, he does have some higher level weakness in his hip and core, contributes to fatigue.  Tolerating increased exercise well.  Has almost met all his goals. Tolerating gym exercise with minimal issues.    Pt will benefit from skilled therapeutic intervention in order to improve on the following deficits Improper body mechanics;Postural dysfunction;Decreased range of motion;Decreased mobility;Decreased strength;Increased fascial restricitons;Decreased activity tolerance   Rehab Potential Good   PT Frequency 2x / week   PT Duration 6 weeks   PT Treatment/Interventions Patient/family education;ADLs/Self Care Home Management;Therapeutic  exercise;Therapeutic activities;Neuromuscular re-education;Manual techniques;Dry needling;Cryotherapy;Electrical Stimulation;Moist Heat;Ultrasound   PT Next Visit Plan FOTO and d/c to HEP if still doing well.    Consulted and Agree with Plan of Care Patient        Problem List Patient Active Problem List   Diagnosis Date Noted  . Right leg pain 07/03/2015  . Lumbago 07/03/2015    Kerin Perna, PTA 08/15/2015 5:47 PM  [place], [place], [address] Phone: [phone]   Fax:  [phone]  Name: Jeff Rogers MRN: [id number] Date of Birth: 01-27-1991

## 2015-08-19 ENCOUNTER — Ambulatory Visit (INDEPENDENT_AMBULATORY_CARE_PROVIDER_SITE_OTHER): Payer: Managed Care, Other (non HMO) | Admitting: Family Medicine

## 2015-08-19 ENCOUNTER — Encounter: Payer: Self-pay | Admitting: Family Medicine

## 2015-08-19 VITALS — BP 145/75 | HR 80 | Wt 210.0 lb

## 2015-08-19 DIAGNOSIS — M545 Low back pain, unspecified: Secondary | ICD-10-CM

## 2015-08-19 NOTE — Patient Instructions (Signed)
Thank you for coming in today. Return as needed.  Continue PT.  We can re-authorize it again.

## 2015-08-19 NOTE — Assessment & Plan Note (Signed)
Doing well. Finish physical therapy. Take meloxicam as needed. Meloxicam is likely increasing blood pressure. Use sparingly. Follow-up as needed.

## 2015-08-19 NOTE — Progress Notes (Signed)
       Jeff Rogers is a 25 y.o. male who presents to [facility]: [facility] Care today for follow-up back pain. Patient was seen last month for back pain. He was started on physical therapy and has done really well. He notes his symptoms almost completely resolved. He has 1 more session with physical therapy. He is back to work with full duties.   No past medical history on file. No past surgical history on file. Social History  Substance Use Topics  . Smoking status: Never Smoker   . Smokeless tobacco: Not on file  . Alcohol Use: No   Family history is unknown by patient.  ROS as above Medications: Current Outpatient Prescriptions  Medication Sig Dispense Refill  . meloxicam (MOBIC) 15 MG tablet Take 1 tablet (15 mg total) by mouth daily. 30 tablet 1  . traZODone (DESYREL) 50 MG tablet Take 0.5-1 tablets (25-50 mg total) by mouth at bedtime as needed for sleep. 30 tablet 3  . zolpidem (AMBIEN) 10 MG tablet Take 1 tablet (10 mg total) by mouth at bedtime as needed for sleep. 30 tablet 5   No current facility-administered medications for this visit.   No Known Allergies   Exam:  BP 145/75 mmHg  Pulse 80  Wt 210 lb (95.255 kg) Gen: Well NAD Back: Nontender normal motion lower Jeff Rogers strength is equal and normal throughout. Normal gait.  No results found for this or any previous visit (from the past 24 hour(s)). No results found.   Please see individual assessment and plan sections.

## 2015-08-22 ENCOUNTER — Encounter: Payer: Managed Care, Other (non HMO) | Admitting: Physical Therapy

## 2015-08-26 ENCOUNTER — Ambulatory Visit: Payer: Managed Care, Other (non HMO) | Admitting: Family Medicine

## 2015-09-13 ENCOUNTER — Encounter: Payer: Self-pay | Admitting: Family Medicine

## 2015-09-13 ENCOUNTER — Ambulatory Visit (INDEPENDENT_AMBULATORY_CARE_PROVIDER_SITE_OTHER): Payer: Managed Care, Other (non HMO) | Admitting: Family Medicine

## 2015-09-13 VITALS — BP 133/79 | HR 75 | Wt 214.0 lb

## 2015-09-13 DIAGNOSIS — G4700 Insomnia, unspecified: Secondary | ICD-10-CM

## 2015-09-13 HISTORY — DX: Insomnia, unspecified: G47.00

## 2015-09-13 MED ORDER — ZOLPIDEM TARTRATE 10 MG PO TABS: 10.0000 mg | ORAL_TABLET | Freq: Every evening | ORAL | Status: DC | PRN

## 2015-09-13 NOTE — Progress Notes (Signed)
       Ruby Colandy Ledyard is a 25 y.o. male who [name] today for follow-up insomnia. Patient is here today to follow-up his insomnia. He's been using Ambien and feels great. He notes this is very helpful. He would like refills of possible.   No past medical history on file. No past surgical history on file. Social History  Substance Use Topics  . Smoking status: Never Smoker   . Smokeless tobacco: Not on file  . Alcohol Use: No   Family history is unknown by patient.  ROS as above Medications: Current Outpatient Prescriptions  Medication Sig Dispense Refill  . meloxicam (MOBIC) 15 MG tablet Take 1 tablet (15 mg total) by mouth daily. 30 tablet 1  . zolpidem (AMBIEN) 10 MG tablet Take 1 tablet (10 mg total) by mouth at bedtime as needed for sleep. 30 tablet 5   No current facility-administered medications for this visit.   No Known Allergies   Exam:  BP 133/79 mmHg  Pulse 75  Wt 214 lb (97.07 kg) Gen: Well NAD Psych: Alert and oriented normal speech thought process and affect.   No results found for this or any previous visit (from the past 24 hour(s)). No results found.   Please see individual assessment and plan sections.

## 2015-09-13 NOTE — Patient Instructions (Signed)
Thank you for coming in today. Use ambien as needed.  Return in 12 months or sooner if needed.

## 2015-09-13 NOTE — Assessment & Plan Note (Signed)
Continue Ambien. Recheck in one year or sooner if needed

## 2015-09-23 ENCOUNTER — Encounter: Payer: Self-pay | Admitting: Family Medicine

## 2015-09-23 ENCOUNTER — Telehealth: Payer: Self-pay | Admitting: Family Medicine

## 2015-09-23 NOTE — Telephone Encounter (Signed)
Patient wanted to know if he could get a work note for Sat because he did not have his sleeping medicine of Friday night and couldn't work on Sat.  I told him that I would send a note and we would let him know if you would do a note for him.  thanks

## 2015-09-23 NOTE — Telephone Encounter (Signed)
Letter written and ready for pick up.

## 2015-09-24 NOTE — Telephone Encounter (Signed)
Called and left vm informing pt that letter has been placed up front for pickup.

## 2015-11-18 ENCOUNTER — Encounter: Payer: Self-pay | Admitting: Family Medicine

## 2015-11-18 ENCOUNTER — Ambulatory Visit (INDEPENDENT_AMBULATORY_CARE_PROVIDER_SITE_OTHER): Payer: Managed Care, Other (non HMO) | Admitting: Family Medicine

## 2015-11-18 VITALS — BP 132/72 | HR 68 | Wt 195.0 lb

## 2015-11-18 DIAGNOSIS — M79604 Pain in right leg: Secondary | ICD-10-CM | POA: Diagnosis not present

## 2015-11-18 DIAGNOSIS — M545 Low back pain, unspecified: Secondary | ICD-10-CM

## 2015-11-18 MED ORDER — MELOXICAM 15 MG PO TABS: 15.0000 mg | ORAL_TABLET | Freq: Every day | ORAL | Status: DC

## 2015-11-18 NOTE — Patient Instructions (Signed)
Thank you for coming in today.   Return as needed.    
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
## 2015-11-19 ENCOUNTER — Other Ambulatory Visit: Payer: Self-pay | Admitting: Family Medicine

## 2015-11-27 ENCOUNTER — Other Ambulatory Visit: Payer: Self-pay | Admitting: Family Medicine

## 2015-12-17 ENCOUNTER — Ambulatory Visit (INDEPENDENT_AMBULATORY_CARE_PROVIDER_SITE_OTHER): Payer: Managed Care, Other (non HMO) | Admitting: Family Medicine

## 2015-12-17 ENCOUNTER — Encounter: Payer: Self-pay | Admitting: Family Medicine

## 2015-12-17 VITALS — BP 126/78 | HR 53 | Wt 196.0 lb

## 2015-12-17 DIAGNOSIS — S93401A Sprain of unspecified ligament of right ankle, initial encounter: Secondary | ICD-10-CM

## 2015-12-17 DIAGNOSIS — G4700 Insomnia, unspecified: Secondary | ICD-10-CM

## 2015-12-17 MED ORDER — ZOLPIDEM TARTRATE 10 MG PO TABS: 10.0000 mg | ORAL_TABLET | Freq: Every evening | ORAL | Status: DC | PRN
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
## 2015-12-17 NOTE — Patient Instructions (Signed)
Thank you for coming in today. Continue ice and ankle brace.  Use ibuprofen as needed.  Return if not better.   Acute Ankle Sprain With Phase I Rehab An acute ankle sprain is a partial or complete tear in one or more of the ligaments of the ankle due to traumatic injury. The severity of the injury depends on both the number of ligaments sprained and the grade of sprain. There are 3 grades of sprains.   A grade 1 sprain is a mild sprain. There is a slight pull without obvious tearing. There is no loss of strength, and the muscle and ligament are the correct length.  A grade 2 sprain is a moderate sprain. There is tearing of fibers within the substance of the ligament where it connects two bones or two cartilages. The length of the ligament is increased, and there is usually decreased strength.  A grade 3 sprain is a complete rupture of the ligament and is uncommon. In addition to the grade of sprain, there are three types of ankle sprains.  Lateral ankle sprains: This is a sprain of one or more of the three ligaments on the outer side (lateral) of the ankle. These are the most common sprains. Medial ankle sprains: There is one large triangular ligament of the inner side (medial) of the ankle that is susceptible to injury. Medial ankle sprains are less common. Syndesmosis, "high ankle," sprains: The syndesmosis is the ligament that connects the two bones of the lower leg. Syndesmosis sprains usually only occur with very severe ankle sprains. SYMPTOMS  Pain, tenderness, and swelling in the ankle, starting at the side of injury that may progress to the whole ankle and foot with time.  "Pop" or tearing sensation at the time of injury.  Bruising that may spread to the heel.  Impaired ability to walk soon after injury. CAUSES   Acute ankle sprains are caused by trauma placed on the ankle that temporarily forces or pries the anklebone (talus) out of its normal socket.  Stretching or tearing of  the ligaments that normally hold the joint in place (usually due to a twisting injury). RISK INCREASES WITH:  Previous ankle sprain.  Sports in which the foot may land awkwardly (i.e., basketball, volleyball, or soccer) or walking or running on uneven or rough surfaces.  Shoes with inadequate support to prevent sideways motion when stress occurs.  Poor strength and flexibility.  Poor balance skills.  Contact sports. PREVENTION   Warm up and stretch properly before activity.  Maintain physical fitness:  Ankle and leg flexibility, muscle strength, and endurance.  Cardiovascular fitness.  Balance training activities.  Use proper technique and have a coach correct improper technique.  Taping, protective strapping, bracing, or high-top tennis shoes may help prevent injury. Initially, tape is best; however, it loses most of its support function within 10 to 15 minutes.  Wear proper-fitted protective shoes (High-top shoes with taping or bracing is more effective than either alone).  Provide the ankle with support during sports and practice activities for 12 months following injury. PROGNOSIS   If treated properly, ankle sprains can be expected to recover completely; however, the length of recovery depends on the degree of injury.  A grade 1 sprain usually heals enough in 5 to 7 days to allow modified activity and requires an average of 6 weeks to heal completely.  A grade 2 sprain requires 6 to 10 weeks to heal completely.  A grade 3 sprain requires 12 to 16 weeks  to heal.  A syndesmosis sprain often takes more than 3 months to heal. RELATED COMPLICATIONS   Frequent recurrence of symptoms may result in a chronic problem. Appropriately addressing the problem the first time decreases the frequency of recurrence and optimizes healing time. Severity of the initial sprain does not predict the likelihood of later instability.  Injury to other structures (bone, cartilage, or  tendon).  A chronically unstable or arthritic ankle joint is a possibility with repeated sprains. TREATMENT Treatment initially involves the use of ice, medication, and compression bandages to help reduce pain and inflammation. Ankle sprains are usually immobilized in a walking cast or boot to allow for healing. Crutches may be recommended to reduce pressure on the injury. After immobilization, strengthening and stretching exercises may be necessary to regain strength and a full range of motion. Surgery is rarely needed to treat ankle sprains. MEDICATION   Nonsteroidal anti-inflammatory medications, such as aspirin and ibuprofen (do not take for the first 3 days after injury or within 7 days before surgery), or other minor pain relievers, such as acetaminophen, are often recommended. Take these as directed by your caregiver. Contact your caregiver immediately if any bleeding, stomach upset, or signs of an allergic reaction occur from these medications.  Ointments applied to the skin may be helpful.  Pain relievers may be prescribed as necessary by your caregiver. Do not take prescription pain medication for longer than 4 to 7 days. Use only as directed and only as much as you need. HEAT AND COLD  Cold treatment (icing) is used to relieve pain and reduce inflammation for acute and chronic cases. Cold should be applied for 10 to 15 minutes every 2 to 3 hours for inflammation and pain and immediately after any activity that aggravates your symptoms. Use ice packs or an ice massage.  Heat treatment may be used before performing stretching and strengthening activities prescribed by your caregiver. Use a heat pack or a warm soak. SEEK IMMEDIATE MEDICAL CARE IF:   Pain, swelling, or bruising worsens despite treatment.  You experience pain, numbness, discoloration, or coldness in the foot or toes.  New, unexplained symptoms develop (drugs used in treatment may produce side effects.) EXERCISES  PHASE  I EXERCISES RANGE OF MOTION (ROM) AND STRETCHING EXERCISES - Ankle Sprain, Acute Phase I, Weeks 1 to 2 These exercises may help you when beginning to restore flexibility in your ankle. You will likely work on these exercises for the 1 to 2 weeks after your injury. Once your physician, physical therapist, or athletic trainer sees adequate progress, he or she will advance your exercises. While completing these exercises, remember:   Restoring tissue flexibility helps normal motion to return to the joints. This allows healthier, less painful movement and activity.  An effective stretch should be held for at least 30 seconds.  A stretch should never be painful. You should only feel a gentle lengthening or release in the stretched tissue. RANGE OF MOTION - Dorsi/Plantar Flexion  While sitting with your right / left knee straight, draw the top of your foot upwards by flexing your ankle. Then reverse the motion, pointing your toes downward.  Hold each position for __________ seconds.  After completing your first set of exercises, repeat this exercise with your knee bent. Repeat __________ times. Complete this exercise __________ times per day.  RANGE OF MOTION - Ankle Alphabet  Imagine your right / left big toe is a pen.  Keeping your hip and knee still, write out the entire  alphabet with your "pen." Make the letters as large as you can without increasing any discomfort. Repeat __________ times. Complete this exercise __________ times per day.  STRENGTHENING EXERCISES - Ankle Sprain, Acute -Phase I, Weeks 1 to 2 These exercises may help you when beginning to restore strength in your ankle. You will likely work on these exercises for 1 to 2 weeks after your injury. Once your physician, physical therapist, or athletic trainer sees adequate progress, he or she will advance your exercises. While completing these exercises, remember:   Muscles can gain both the endurance and the strength needed for  everyday activities through controlled exercises.  Complete these exercises as instructed by your physician, physical therapist, or athletic trainer. Progress the resistance and repetitions only as guided.  You may experience muscle soreness or fatigue, but the pain or discomfort you are trying to eliminate should never worsen during these exercises. If this pain does worsen, stop and make certain you are following the directions exactly. If the pain is still present after adjustments, discontinue the exercise until you can discuss the trouble with your clinician. STRENGTH - Dorsiflexors  Secure a rubber exercise band/tubing to a fixed object (i.e., table, pole) and loop the other end around your right / left foot.  Sit on the floor facing the fixed object. The band/tubing should be slightly tense when your foot is relaxed.  Slowly draw your foot back toward you using your ankle and toes.  Hold this position for __________ seconds. Slowly release the tension in the band and return your foot to the starting position. Repeat __________ times. Complete this exercise __________ times per day.  STRENGTH - Plantar-flexors   Sit with your right / left leg extended. Holding onto both ends of a rubber exercise band/tubing, loop it around the ball of your foot. Keep a slight tension in the band.  Slowly push your toes away from you, pointing them downward.  Hold this position for __________ seconds. Return slowly, controlling the tension in the band/tubing. Repeat __________ times. Complete this exercise __________ times per day.  STRENGTH - Ankle Eversion  Secure one end of a rubber exercise band/tubing to a fixed object (table, pole). Loop the other end around your foot just before your toes.  Place your fists between your knees. This will focus your strengthening at your ankle.  Drawing the band/tubing across your opposite foot, slowly, pull your little toe out and up. Make sure the band/tubing  is positioned to resist the entire motion.  Hold this position for __________ seconds. Have your muscles resist the band/tubing as it slowly pulls your foot back to the starting position.  Repeat __________ times. Complete this exercise __________ times per day.  STRENGTH - Ankle Inversion  Secure one end of a rubber exercise band/tubing to a fixed object (table, pole). Loop the other end around your foot just before your toes.  Place your fists between your knees. This will focus your strengthening at your ankle.  Slowly, pull your big toe up and in, making sure the band/tubing is positioned to resist the entire motion.  Hold this position for __________ seconds.  Have your muscles resist the band/tubing as it slowly pulls your foot back to the starting position. Repeat __________ times. Complete this exercises __________ times per day.  STRENGTH - Towel Curls  Sit in a chair positioned on a non-carpeted surface.  Place your right / left foot on a towel, keeping your heel on the floor.  Pull the  towel toward your heel by only curling your toes. Keep your heel on the floor.  If instructed by your physician, physical therapist, or athletic trainer, add weight to the end of the towel. Repeat __________ times. Complete this exercise __________ times per day.   This information is not intended to replace advice given to you by your health care provider. Make sure you discuss any questions you have with your health care provider.   Document Released: 12/24/2004 Document Revised: 06/15/2014 Document Reviewed: 09/06/2008 Elsevier Interactive Patient Education [employer].

## 2017-02-23 ENCOUNTER — Ambulatory Visit (INDEPENDENT_AMBULATORY_CARE_PROVIDER_SITE_OTHER): Payer: Self-pay | Admitting: Family Medicine

## 2017-02-23 ENCOUNTER — Encounter: Payer: Self-pay | Admitting: Family Medicine

## 2017-02-23 VITALS — BP 121/60 | HR 86 | Ht 68.0 in | Wt 188.2 lb

## 2017-02-23 DIAGNOSIS — F329 Major depressive disorder, single episode, unspecified: Secondary | ICD-10-CM

## 2017-02-23 DIAGNOSIS — F321 Major depressive disorder, single episode, moderate: Secondary | ICD-10-CM

## 2017-02-23 DIAGNOSIS — F411 Generalized anxiety disorder: Secondary | ICD-10-CM

## 2017-02-23 DIAGNOSIS — G4700 Insomnia, unspecified: Secondary | ICD-10-CM

## 2017-02-23 HISTORY — DX: Generalized anxiety disorder: F41.1

## 2017-02-23 HISTORY — DX: Major depressive disorder, single episode, unspecified: F32.9

## 2017-02-23 MED ORDER — SERTRALINE HCL 50 MG PO TABS: ORAL_TABLET | ORAL | 1 refills | Status: DC

## 2017-02-23 MED ORDER — ZOLPIDEM TARTRATE 10 MG PO TABS: 10.0000 mg | ORAL_TABLET | Freq: Every evening | ORAL | 5 refills | Status: DC | PRN

## 2017-02-23 NOTE — Progress Notes (Signed)
Jeff Rogers is a 26 y.o. male who presents to [facility]: Primary Care Sports Medicine today for anxiety depression and insomnia. Patient has a history of insomnia than previously was well controlled with Ambien. He's been out of Ambien now for over half the year. He notes over the past week or 2 his anxiety and depression symptoms worsened. He denies any SI or HI denies any past history of anxiety or depression. The symptoms are becoming somewhat obnoxious and interfering with work. He was unable to go to work yesterday or today.   Past Medical History:  Diagnosis Date  . Insomnia 09/13/2015   No past surgical history on file. Social History  Substance Use Topics  . Smoking status: Never Smoker  . Smokeless tobacco: Never Used  . Alcohol use No   Family history is unknown by patient.  ROS as above:  Medications: Current Outpatient Prescriptions  Medication Sig Dispense Refill  . sertraline (ZOLOFT) 50 MG tablet 1/2 pill po daily for 1 week then 1 pill po daily for 1 week then 2 pill po daily 30 tablet 1  . zolpidem (AMBIEN) 10 MG tablet Take 1 tablet (10 mg total) by mouth at bedtime as needed. for sleep 30 tablet 5   No current facility-administered medications for this visit.    No Known Allergies  Health Maintenance Health Maintenance  Topic Date Due  . HIV Screening  10/15/2006  . TETANUS/TDAP  10/15/2010  . INFLUENZA VACCINE  01/06/2018 (Originally 01/06/2017)     Exam:  BP 121/60   Pulse 86   Ht  (1.727 m)   Wt 188 lb 3.2 oz (85.4 kg)   SpO2 99%   BMI 28.62 kg/m  Gen: Well NAD HEENT: EOMI,  MMM Lungs: Normal work of breathing. CTABL Heart: RRR no MRG Abd: NABS, Soft. Nondistended, Nontender Exts: Brisk capillary refill, warm and well perfused.  Psych alert and oriented normal speech thought process and affect. No SI or HI expressed.  Depression screen PHQ  2/9 02/23/2017  Decreased Interest 2  Down, Depressed, Hopeless 2  PHQ - 2 Score 4  Altered sleeping 3  Tired, decreased energy 3  Change in appetite 2  Feeling bad or failure about yourself  0  Trouble concentrating 3  Moving slowly or fidgety/restless 0  Suicidal thoughts 1  PHQ-9 Score 16   GAD 7 : Generalized Anxiety Score 02/23/2017  Nervous, Anxious, on Edge 1  Control/stop worrying 3  Worry too much - different things 1  Trouble relaxing 3  Restless 3  Easily annoyed or irritable 0  Afraid - awful might happen 0  Total GAD 7 Score 11  Anxiety Difficulty Very difficult       No results found for this or any previous visit (from the past 72 hour(s)). No results found.    Assessment and Plan: 26 y.o. male with  Anxiety and depression: Probably partially related to recent insomnia. Plan to start treatment with Zoloft titration and recheck in 3 weeks. Ultimate goal is for 100 mg per day.  Insomnia: Restart treatment with Ambien and recheck in 3 weeks.   No orders of the defined types were placed in this encounter.  Meds ordered this encounter  Medications  . zolpidem (AMBIEN) 10 MG tablet    Sig: Take 1 tablet (10 mg total) by mouth at bedtime as needed. for sleep    Dispense:  30 tablet    Refill:  5  .  sertraline (ZOLOFT) 50 MG tablet    Sig: 1/2 pill po daily for 1 week then 1 pill po daily for 1 week then 2 pill po daily    Dispense:  30 tablet    Refill:  1     Discussed warning signs or symptoms. Please see discharge instructions. Patient expresses understanding.

## 2017-02-23 NOTE — Patient Instructions (Signed)
Thank you for coming in today. Restart Ambien at bedtime as needed.   Start zoloft (sertaline)  Take 1/2 pill (  ) daily for 1 week.  Increase it to 1 pill daily for 1 week then  Increase to 2 pills daily.   Recheck in 3 weeks or sooner if needed.   Sertraline tablets What is this medicine? SERTRALINE (SER tra leen) is used to treat depression. It may also be used to treat obsessive compulsive disorder, panic disorder, post-trauma stress, premenstrual dysphoric disorder (PMDD) or social anxiety. This medicine may be used for other purposes; ask your health care provider or pharmacist if you have questions. COMMON BRAND NAME(S): Zoloft What should I tell my health care provider before I take this medicine? They need to know if you have any of these conditions: -bleeding disorders -bipolar disorder or a family history of bipolar disorder -glaucoma -heart disease -high blood pressure -history of irregular heartbeat -history of low levels of calcium, magnesium, or potassium in the blood -if you often drink alcohol -liver disease -receiving electroconvulsive therapy -seizures -suicidal thoughts, plans, or attempt; a previous suicide attempt by you or a family member -take medicines that treat or prevent blood clots -thyroid disease -an unusual or allergic reaction to sertraline, other medicines, foods, dyes, or preservatives -pregnant or trying to get pregnant -breast-feeding How should I use this medicine? Take this medicine by mouth with a glass of water. Follow the directions on the prescription label. You can take it with or without food. Take your medicine at regular intervals. Do not take your medicine more often than directed. Do not stop taking this medicine suddenly except upon the advice of your doctor. Stopping this medicine too quickly may cause serious side effects or your condition may worsen. A special MedGuide will be given to you by the pharmacist with each  prescription and refill. Be sure to read this information carefully each time. Talk to your pediatrician regarding the use of this medicine in children. While this drug may be prescribed for children as young as 7 years for selected conditions, precautions do apply. Overdosage: If you think you have taken too much of this medicine contact a poison control center or emergency room at once. NOTE: This medicine is only for you. Do not share this medicine with others. What if I miss a dose? If you miss a dose, take it as soon as you can. If it is almost time for your next dose, take only that dose. Do not take double or extra doses. What may interact with this medicine? Do not take this medicine with any of the following medications: -cisapride -dofetilide -dronedarone -linezolid -MAOIs like Carbex, Eldepryl, Marplan, Nardil, and Parnate -methylene blue (injected into a vein) -pimozide -thioridazine This medicine may also interact with the following medications: -alcohol -amphetamines -aspirin and aspirin-like medicines -certain medicines for depression, anxiety, or psychotic disturbances -certain medicines for fungal infections like ketoconazole, fluconazole, posaconazole, and itraconazole -certain medicines for irregular heart beat like flecainide, quinidine, propafenone -certain medicines for migraine headaches like almotriptan, eletriptan, frovatriptan, naratriptan, rizatriptan, sumatriptan, zolmitriptan -certain medicines for sleep -certain medicines for seizures like carbamazepine, valproic acid, phenytoin -certain medicines that treat or prevent blood clots like warfarin, enoxaparin, dalteparin -cimetidine -digoxin -diuretics -fentanyl -isoniazid -lithium -NSAIDs, medicines for pain and inflammation, like ibuprofen or naproxen -other medicines that prolong the QT interval (cause an abnormal heart rhythm) -rasagiline -safinamide -supplements like St. John's wort, kava kava,  valerian -tolbutamide -tramadol -tryptophan This list may not describe  all possible interactions. Give your health care provider a list of all the medicines, herbs, non-prescription drugs, or dietary supplements you use. Also tell them if you smoke, drink alcohol, or use illegal drugs. Some items may interact with your medicine. What should I watch for while using this medicine? Tell your doctor if your symptoms do not get better or if they get worse. Visit your doctor or health care professional for regular checks on your progress. Because it may take several weeks to see the full effects of this medicine, it is important to continue your treatment as prescribed by your doctor. Patients and their families should watch out for new or worsening thoughts of suicide or depression. Also watch out for sudden changes in feelings such as feeling anxious, agitated, panicky, irritable, hostile, aggressive, impulsive, severely restless, overly excited and hyperactive, or not being able to sleep. If this happens, especially at the beginning of treatment or after a change in dose, call your health care professional. Bonita Quin may get drowsy or dizzy. Do not drive, use machinery, or do anything that needs mental alertness until you know how this medicine affects you. Do not stand or sit up quickly, especially if you are an older patient. This reduces the risk of dizzy or fainting spells. Alcohol may interfere with the effect of this medicine. Avoid alcoholic drinks. Your mouth may get dry. Chewing sugarless gum or sucking hard candy, and drinking plenty of water may help. Contact your doctor if the problem does not go away or is severe. What side effects may I notice from receiving this medicine? Side effects that you should report to your doctor or health care professional as soon as possible: -allergic reactions like skin rash, itching or hives, swelling of the face, lips, or tongue -anxious -black, tarry  stools -changes in vision -confusion -elevated mood, decreased need for sleep, racing thoughts, impulsive behavior -eye pain -fast, irregular heartbeat -feeling faint or lightheaded, falls -feeling agitated, angry, or irritable -hallucination, loss of contact with reality -loss of balance or coordination -loss of memory -painful or prolonged erections -restlessness, pacing, inability to keep still -seizures -stiff muscles -suicidal thoughts or other mood changes -trouble sleeping -unusual bleeding or bruising -unusually weak or tired -vomiting Side effects that usually do not require medical attention (report to your doctor or health care professional if they continue or are bothersome): -change in appetite or weight -change in sex drive or performance -diarrhea -increased sweating -indigestion, nausea -tremors This list may not describe all possible side effects. Call your doctor for medical advice about side effects. You may report side effects to FDA at 1-800-FDA-1088. Where should I keep my medicine? Keep out of the reach of children. Store at room temperature between 15 and 30 degrees C (59 and 86 degrees F). Throw away any unused medicine after the expiration date. NOTE: This sheet is a summary. It may not cover all possible information. If you have questions about this medicine, talk to your doctor, pharmacist, or health care provider.  2018 Elsevier/Gold Standard (2016-05-29 14:17:49)

## 2017-03-22 ENCOUNTER — Ambulatory Visit: Payer: Self-pay | Admitting: Family Medicine

## 2017-03-22 DIAGNOSIS — Z0189 Encounter for other specified special examinations: Secondary | ICD-10-CM

## 2017-04-20 ENCOUNTER — Encounter: Payer: Self-pay | Admitting: Family Medicine

## 2017-04-20 ENCOUNTER — Ambulatory Visit (INDEPENDENT_AMBULATORY_CARE_PROVIDER_SITE_OTHER): Payer: Managed Care, Other (non HMO) | Admitting: Family Medicine

## 2017-04-20 VITALS — BP 115/65 | Ht 69.0 in | Wt 193.1 lb

## 2017-04-20 DIAGNOSIS — F321 Major depressive disorder, single episode, moderate: Secondary | ICD-10-CM | POA: Diagnosis not present

## 2017-04-20 DIAGNOSIS — F411 Generalized anxiety disorder: Secondary | ICD-10-CM | POA: Diagnosis not present

## 2017-04-20 DIAGNOSIS — G4700 Insomnia, unspecified: Secondary | ICD-10-CM

## 2017-04-20 MED ORDER — FLUOXETINE HCL 20 MG PO TABS: 20.0000 mg | ORAL_TABLET | Freq: Every day | ORAL | 3 refills | Status: DC

## 2017-04-20 NOTE — Progress Notes (Signed)
Jeff Rogers is a 26 y.o. male who [name] today for anxiety, depression and insomnia.  And he has had difficulty with anxiety and depression over the last several years. He was started on Zoloft but had difficulty tolerating it as it caused headaches and nausea. He notes his symptoms are still present and interfering with his ability to go to work and take care of himself at times. He is interested in different medications if possible.  Additionally he notes insomnia that typically is well-controlled with intermittent Ambien. Is quite satisfied with how well this is going.   Past Medical History:  Diagnosis Date  . GAD (generalized anxiety disorder) 02/23/2017  . Insomnia 09/13/2015  . MDD (major depressive disorder) 02/23/2017   No past surgical history on file. Social History   Tobacco Use  . Smoking status: Never Smoker  . Smokeless tobacco: Never Used  Substance Use Topics  . Alcohol use: No   Family history is unknown by patient.  ROS as above:  Medications: Current Outpatient Medications  Medication Sig Dispense Refill  . FLUoxetine (PROZAC) 20 MG tablet Take 1 tablet (20 mg total) daily by mouth. 30 tablet 3  . zolpidem (AMBIEN) 10 MG tablet Take 1 tablet (10 mg total) by mouth at bedtime as needed. for sleep 30 tablet 5   No current facility-administered medications for this visit.    No Known Allergies  Health Maintenance Health Maintenance  Topic Date Due  . HIV Screening  10/15/2006  . TETANUS/TDAP  10/15/2010  . INFLUENZA VACCINE  01/06/2018 (Originally 01/06/2017)     Exam:  BP 115/65   Ht 5\' 9"  (1.753 m)   Wt 193 lb 1.9 oz (87.6 kg)   BMI 28.52 kg/m  Gen: Well NAD HEENT: EOMI,  MMM Lungs: Normal work of breathing. CTABL Heart: RRR no MRG Abd: NABS, Soft. Nondistended, Nontender Exts: Brisk capillary refill, warm  and well perfused.  Psych alert and oriented normal speech thought process and affect.  [facility] 04/20/2017 04/20/2017 02/23/2017  Decreased Interest 2 2 2   Down, Depressed, Hopeless 1 1 2   PHQ - 2 Score 3 3 4   Altered sleeping 2 2 3   Tired, decreased energy 3 3 3   Change in appetite 3 3 2   Feeling bad or failure about yourself  2 2 0  Trouble concentrating 2 2 3   Moving slowly or fidgety/restless 2 2 0  Suicidal thoughts 0 0 1  PHQ-9 Score 17 17 16   Difficult doing work/chores Somewhat difficult - -   GAD 7 : Generalized Anxiety Score 04/20/2017 04/20/2017 02/23/2017  Nervous, Anxious, on Edge 2 2 1   Control/stop worrying 1 1 3   Worry too much - different things 1 1 1   Trouble relaxing 2 2 3   Restless 0 0 3  Easily annoyed or irritable 0 0 0  Afraid - awful might happen 0 0 0  Total GAD 7 Score 6 6 11   Anxiety Difficulty Somewhat difficult - Very difficult       No results found for this or any previous visit (from the past 72 hour(s)). No results found.    Assessment and Plan: 26 y.o. male with depression and anxiety: Not well controlled. Patient intolerant of Zoloft. Plan to switch to Prozac and recheck in one month.  Insomnia typically pretty well controlled continue intermittent Ambien and recheck in 1 month as noted above.  Influenza vaccine declined.  No orders of the defined types were placed in this encounter.  Meds ordered this encounter  Medications  . FLUoxetine (PROZAC) 20 MG tablet    Sig: Take 1 tablet (20 mg total) daily by mouth.    Dispense:  30 tablet    Refill:  3     Discussed warning signs or symptoms. Please see discharge instructions. Patient expresses understanding.

## 2017-04-20 NOTE — Patient Instructions (Signed)
Thank you for coming in today. Start Prozac 1/2 pill for 1 week then increase to 1 pill daily.  Recheck in 1 month.  Return sooner if needed.  Let me know if you have any problems with this medicine before you come back.   Fluoxetine capsules or tablets (Depression/Mood Disorders) What is this medicine? FLUOXETINE (floo OX e teen) belongs to a class of drugs known as selective serotonin reuptake inhibitors (SSRIs). It helps to treat mood problems such as depression, obsessive compulsive disorder, and panic attacks. It can also treat certain eating disorders. This medicine may be used for other purposes; ask your health care provider or pharmacist if you have questions. COMMON BRAND NAME(S): Prozac What should I tell my health care provider before I take this medicine? They need to know if you have any of these conditions: -bipolar disorder or a family history of bipolar disorder -bleeding disorders -glaucoma -heart disease -liver disease -low levels of sodium in the blood -seizures -suicidal thoughts, plans, or attempt; a previous suicide attempt by you or a family member -take MAOIs like Carbex, Eldepryl, Marplan, Nardil, and Parnate -take medicines that treat or prevent blood clots -thyroid disease -an unusual or allergic reaction to fluoxetine, other medicines, foods, dyes, or preservatives -pregnant or trying to get pregnant -breast-feeding How should I use this medicine? Take this medicine by mouth with a glass of water. Follow the directions on the prescription label. You can take this medicine with or without food. Take your medicine at regular intervals. Do not take it more often than directed. Do not stop taking this medicine suddenly except upon the advice of your doctor. Stopping this medicine too quickly may cause serious side effects or your condition may worsen. A special MedGuide will be given to you by the pharmacist with each prescription and refill. Be sure to read this  information carefully each time. Talk to your pediatrician regarding the use of this medicine in children. While this drug may be prescribed for children as young as 7 years for selected conditions, precautions do apply. Overdosage: If you think you have taken too much of this medicine contact a poison control center or emergency room at once. NOTE: This medicine is only for you. Do not share this medicine with others. What if I miss a dose? If you miss a dose, skip the missed dose and go back to your regular dosing schedule. Do not take double or extra doses. What may interact with this medicine? Do not take this medicine with any of the following medications: -other medicines containing fluoxetine, like Sarafem or Symbyax -cisapride -linezolid -MAOIs like Carbex, Eldepryl, Marplan, Nardil, and Parnate -methylene blue (injected into a vein) -pimozide -thioridazine This medicine may also interact with the following medications: -alcohol -amphetamines -aspirin and aspirin-like medicines -carbamazepine -certain medicines for depression, anxiety, or psychotic disturbances -certain medicines for migraine headaches like almotriptan, eletriptan, frovatriptan, naratriptan, rizatriptan, sumatriptan, zolmitriptan -digoxin -diuretics -fentanyl -flecainide -furazolidone -isoniazid -lithium -medicines for sleep -medicines that treat or prevent blood clots like warfarin, enoxaparin, and dalteparin -NSAIDs, medicines for pain and inflammation, like ibuprofen or naproxen -phenytoin -procarbazine -propafenone -rasagiline -ritonavir -supplements like St. John's wort, kava kava, valerian -tramadol -tryptophan -vinblastine This list may not describe all possible interactions. Give your health care provider a list of all the medicines, herbs, non-prescription drugs, or dietary supplements you use. Also tell them if you smoke, drink alcohol, or use illegal drugs. Some items may interact with your  medicine. What should I watch for  while using this medicine? Tell your doctor if your symptoms do not get better or if they get worse. Visit your doctor or health care professional for regular checks on your progress. Because it may take several weeks to see the full effects of this medicine, it is important to continue your treatment as prescribed by your doctor. Patients and their families should watch out for new or worsening thoughts of suicide or depression. Also watch out for sudden changes in feelings such as feeling anxious, agitated, panicky, irritable, hostile, aggressive, impulsive, severely restless, overly excited and hyperactive, or not being able to sleep. If this happens, especially at the beginning of treatment or after a change in dose, call your health care professional. Bonita QuinYou may get drowsy or dizzy. Do not drive, use machinery, or do anything that needs mental alertness until you know how this medicine affects you. Do not stand or sit up quickly, especially if you are an older patient. This reduces the risk of dizzy or fainting spells. Alcohol may interfere with the effect of this medicine. Avoid alcoholic drinks. Your mouth may get dry. Chewing sugarless gum or sucking hard candy, and drinking plenty of water may help. Contact your doctor if the problem does not go away or is severe. This medicine may affect blood sugar levels. If you have diabetes, check with your doctor or health care professional before you change your diet or the dose of your diabetic medicine. What side effects may I notice from receiving this medicine? Side effects that you should report to your doctor or health care professional as soon as possible: -allergic reactions like skin rash, itching or hives, swelling of the face, lips, or tongue -anxious -black, tarry stools -breathing problems -changes in vision -confusion -elevated mood, decreased need for sleep, racing thoughts, impulsive behavior -eye  pain -fast, irregular heartbeat -feeling faint or lightheaded, falls -feeling agitated, angry, or irritable -hallucination, loss of contact with reality -loss of balance or coordination -loss of memory -painful or prolonged erections -restlessness, pacing, inability to keep still -seizures -stiff muscles -suicidal thoughts or other mood changes -trouble sleeping -unusual bleeding or bruising -unusually weak or tired -vomiting Side effects that usually do not require medical attention (report to your doctor or health care professional if they continue or are bothersome): -change in appetite or weight -change in sex drive or performance -diarrhea -dry mouth -headache -increased sweating -nausea -tremors This list may not describe all possible side effects. Call your doctor for medical advice about side effects. You may report side effects to FDA at 1-800-FDA-1088. Where should I keep my medicine? Keep out of the reach of children. Store at room temperature between 15 and 30 degrees C (59 and 86 degrees F). Throw away any unused medicine after the expiration date. NOTE: This sheet is a summary. It may not cover all possible information. If you have questions about this medicine, talk to your doctor, pharmacist, or health care provider.  2018 Elsevier/Gold Standard (2015-10-26 15:55:27)

## 2017-05-10 ENCOUNTER — Encounter: Payer: Self-pay | Admitting: Family Medicine

## 2017-05-10 ENCOUNTER — Ambulatory Visit: Payer: Managed Care, Other (non HMO) | Admitting: Family Medicine

## 2017-05-10 VITALS — BP 122/81 | HR 80 | Ht 68.0 in | Wt 200.0 lb

## 2017-05-10 DIAGNOSIS — F321 Major depressive disorder, single episode, moderate: Secondary | ICD-10-CM

## 2017-05-10 DIAGNOSIS — K5900 Constipation, unspecified: Secondary | ICD-10-CM | POA: Diagnosis not present

## 2017-05-10 DIAGNOSIS — F411 Generalized anxiety disorder: Secondary | ICD-10-CM

## 2017-05-10 MED ORDER — POLYETHYLENE GLYCOL 3350 17 GM/SCOOP PO POWD: 17.0000 g | Freq: Every day | ORAL | 1 refills | Status: DC

## 2017-05-10 MED ORDER — FLUOXETINE HCL 20 MG PO TABS: 20.0000 mg | ORAL_TABLET | Freq: Every day | ORAL | 3 refills | Status: DC

## 2017-05-10 NOTE — Patient Instructions (Addendum)
Thank you for coming in today. Continue Prozac.  We will want to recheck in 6 months.  Otherwise return as needed  Try taking miralax for constipation as needed.

## 2017-05-10 NOTE — Progress Notes (Signed)
Jeff Rogers is a 26 y.o. male who [facility] today for follow-up anxiety and discuss constipation.  Jeff Rogers has a history of generalized anxiety disorder complicated by MDD. He was started on Prozac recently and feels a lot better. He is happy with how things are going.   He doesn't orbit of constipation recently as well. He has been trying to change his diet to include more fiber and thinks that is helpful.  Otherwise he feels fine with no significant abdominal pain.   Past Medical History:  Diagnosis Date  . GAD (generalized anxiety disorder) 02/23/2017  . Insomnia 09/13/2015  . MDD (major depressive disorder) 02/23/2017   No past surgical history on file. Social History   Tobacco Use  . Smoking status: Never Smoker  . Smokeless tobacco: Never Used  Substance Use Topics  . Alcohol use: No   Family history is unknown by patient.  ROS as above:  Medications: Current Outpatient Medications  Medication Sig Dispense Refill  . FLUoxetine (PROZAC) 20 MG tablet Take 1 tablet (20 mg total) by mouth daily. 90 tablet 3  . zolpidem (AMBIEN) 10 MG tablet Take 1 tablet (10 mg total) by mouth at bedtime as needed. for sleep 30 tablet 5  . polyethylene glycol powder (GLYCOLAX/MIRALAX) powder Take 17 g by mouth daily. 850 g 1   No current facility-administered medications for this visit.    No Known Allergies  Health Maintenance Health Maintenance  Topic Date Due  . HIV Screening  10/15/2006  . TETANUS/TDAP  10/15/2010  . INFLUENZA VACCINE  01/06/2018 (Originally 01/06/2017)     Exam:  BP 122/81   Pulse 80   Ht 5\' 8"  (1.727 m)   Wt 200 lb (90.7 kg)   BMI 30.41 kg/m  Gen: Well NAD HEENT: EOMI,  MMM Lungs: Normal work of breathing. CTABL Heart: RRR no MRG Abd: NABS, Soft. Nondistended, Nontender Exts: Brisk capillary refill, warm and well perfused.   Psych alert and oriented normal speech thought process and affect.  [facility] 05/10/2017 04/20/2017 04/20/2017 02/23/2017  Decreased Interest 0 2 2 2   Down, Depressed, Hopeless 0 1 1 2   PHQ - 2 Score 0 3 3 4   Altered sleeping 0 2 2 3   Tired, decreased energy 0 3 3 3   Change in appetite 0 3 3 2   Feeling bad or failure about yourself  0 2 2 0  Trouble concentrating 0 2 2 3   Moving slowly or fidgety/restless 0 2 2 0  Suicidal thoughts 0 0 0 1  PHQ-9 Score 0 17 17 16   Difficult doing work/chores Not difficult at all Somewhat difficult - -   GAD 7 : Generalized Anxiety Score 05/10/2017 04/20/2017 04/20/2017 02/23/2017  Nervous, Anxious, on Edge 0 2 2 1   Control/stop worrying 0 1 1 3   Worry too much - different things 0 1 1 1   Trouble relaxing 2 2 2 3   Restless 0 0 0 3  Easily annoyed or irritable 0 0 0 0  Afraid - awful might happen 0 0 0 0  Total GAD 7 Score 2 6 6 11   Anxiety Difficulty Not difficult at all Somewhat difficult - Very difficult       No results found for this or any previous visit (from the past 72 hour(s)). No results found.    Assessment and Plan: 26 y.o. male with  Anxiety and depression doing well. Continue Prozac  and recheck in 6 months.  Constipation trial of MiraLAX. Recheck in 6 months.   No orders of the defined types were placed in this encounter.  Meds ordered this encounter  Medications  . FLUoxetine (PROZAC) 20 MG tablet    Sig: Take 1 tablet (20 mg total) by mouth daily.    Dispense:  90 tablet    Refill:  3  . polyethylene glycol powder (GLYCOLAX/MIRALAX) powder    Sig: Take 17 g by mouth daily.    Dispense:  850 g    Refill:  1     Discussed warning signs or symptoms. Please see discharge instructions. Patient expresses understanding.

## 2017-05-21 ENCOUNTER — Ambulatory Visit: Payer: Managed Care, Other (non HMO) | Admitting: Family Medicine

## 2017-06-04 ENCOUNTER — Ambulatory Visit (INDEPENDENT_AMBULATORY_CARE_PROVIDER_SITE_OTHER): Payer: Managed Care, Other (non HMO) | Admitting: Family Medicine

## 2017-06-04 ENCOUNTER — Encounter: Payer: Self-pay | Admitting: Family Medicine

## 2017-06-04 VITALS — BP 129/81 | HR 89 | Ht 69.0 in | Wt 198.0 lb

## 2017-06-04 DIAGNOSIS — F411 Generalized anxiety disorder: Secondary | ICD-10-CM

## 2017-06-04 DIAGNOSIS — F321 Major depressive disorder, single episode, moderate: Secondary | ICD-10-CM | POA: Diagnosis not present

## 2017-06-04 MED ORDER — CLONAZEPAM 0.5 MG PO TABS: 0.5000 mg | ORAL_TABLET | Freq: Two times a day (BID) | ORAL | 0 refills | Status: DC | PRN

## 2017-06-04 NOTE — Patient Instructions (Signed)
Thank you for coming in today. Restart Prozac at 1/2 pill for 1 week then increase to full pill.  Take klonopin twice daily as needed for significant anxiety.  Recheck in 1 week.  "Return sooner if needed.

## 2017-06-04 NOTE — Progress Notes (Signed)
Jeff Rogers is a 26 y.o. male who [name] today for anxiety and depression.  Jeff Rogers was doing well with Prozac recently.  He stopped it about 2 weeks ago notes that he got worsening depression and anxiety symptoms.  He since restarted the medication a few days ago notes that he is more jittery and more anxious than usual.  He is having trouble sleeping.  He notes that he tolerated Prozac pretty well when he was taking it.   Past Medical History:  Diagnosis Date  . GAD (generalized anxiety disorder) 02/23/2017  . Insomnia 09/13/2015  . MDD (major depressive disorder) 02/23/2017   No past surgical history on file. Social History   Tobacco Use  . Smoking status: Never Smoker  . Smokeless tobacco: Never Used  Substance Use Topics  . Alcohol use: No   Family history is unknown by patient.  ROS as above:  Medications: Current Outpatient Medications  Medication Sig Dispense Refill  . FLUoxetine (PROZAC) 20 MG tablet Take 1 tablet (20 mg total) by mouth daily. 90 tablet 3  . polyethylene glycol powder (GLYCOLAX/MIRALAX) powder Take 17 g by mouth daily. 850 g 1  . zolpidem (AMBIEN) 10 MG tablet Take 1 tablet (10 mg total) by mouth at bedtime as needed. for sleep 30 tablet 5  . clonazePAM (KLONOPIN) 0.5 MG tablet Take 1 tablet (0.5 mg total) by mouth 2 (two) times daily as needed for anxiety. 30 tablet 0   No current facility-administered medications for this visit.    No Known Allergies  Health Maintenance Health Maintenance  Topic Date Due  . HIV Screening  10/15/2006  . TETANUS/TDAP  10/15/2010  . INFLUENZA VACCINE  01/06/2018 (Originally 01/06/2017)     Exam:  BP 129/81   Pulse 89   Ht 5\' 9"  (1.753 m)   Wt 198 lb (89.8 kg)   BMI 29.24 kg/m  Gen: Well NAD HEENT: EOMI,  MMM Lungs: Normal work of breathing. CTABL Heart: RRR no MRG Abd:  NABS, Soft. Nondistended, Nontender Exts: Brisk capillary refill, warm and well perfused.  Psych: Alert and oriented normal speech thought process and affect.  No psychomotor agitation.  No SI or HI expressed.  GAD 7 : Generalized Anxiety Score 06/04/2017 05/10/2017 04/20/2017 04/20/2017  Nervous, Anxious, on Edge 2 0 2 2  Control/stop worrying 3 0 1 1  Worry too much - different things 3 0 1 1  Trouble relaxing 3 2 2 2   Restless 0 0 0 0  Easily annoyed or irritable 3 0 0 0  Afraid - awful might happen 3 0 0 0  Total GAD 7 Score 17 2 6 6   Anxiety Difficulty Very difficult Not difficult at all Somewhat difficult -    [facility] 06/04/2017 05/10/2017 04/20/2017 04/20/2017 02/23/2017  Decreased Interest 3 0 2 2 2   Down, Depressed, Hopeless 3 0 1 1 2   PHQ - 2 Score 6 0 3 3 4   Altered sleeping 2 0 2 2 3   Tired, decreased energy 1 0 3 3 3   Change in appetite 2 0 3 3 2   Feeling bad or failure about yourself  1 0 2 2 0  Trouble concentrating 2 0 2 2 3   Moving slowly or fidgety/restless 1 0 2 2 0  Suicidal thoughts 2 0 0 0 1  PHQ-9 Score 17 0 17 17 16   Difficult doing work/chores - Not difficult at  all Somewhat difficult - -      No results found for this or any previous visit (from the past 72 hour(s)). No results found.    Assessment and Plan: 26 y.o. male with  Worsening anxiety and depression.  I think some of this is symptoms returning after he stopped Prozac and I think some of this may be too high of a dose at first.  Plan to decrease Prozac to 10 mg for a week or so with an increase back up to 20.  Additionally will use low-dose Klonopin for a few weeks to help transition back to Prozac.  Recheck in 1 month or sooner if needed.  Work note provided.   No orders of the defined types were placed in this encounter.  Meds ordered this encounter  Medications  . clonazePAM (KLONOPIN) 0.5 MG tablet    Sig: Take 1 tablet (0.5 mg total) by mouth 2 (two) times daily as  needed for anxiety.    Dispense:  30 tablet    Refill:  0     Discussed warning signs or symptoms. Please see discharge instructions. Patient expresses understanding.  I spent 25 minutes with this patient, greater than 50% was face-to-face time counseling regarding ddx and treatment plan.

## 2017-06-07 ENCOUNTER — Ambulatory Visit: Payer: Managed Care, Other (non HMO) | Admitting: Family Medicine

## 2017-06-11 ENCOUNTER — Ambulatory Visit: Payer: Managed Care, Other (non HMO) | Admitting: Family Medicine

## 2017-06-11 ENCOUNTER — Telehealth: Payer: Self-pay | Admitting: Family Medicine

## 2017-06-11 DIAGNOSIS — Z0189 Encounter for other specified special examinations: Secondary | ICD-10-CM

## 2017-06-11 NOTE — Telephone Encounter (Signed)
I called Jeff Rogers this morning because he missed his appointment. I asked him to call back as needed.

## 2017-06-29 ENCOUNTER — Ambulatory Visit: Payer: Managed Care, Other (non HMO) | Admitting: Family Medicine

## 2017-06-29 ENCOUNTER — Ambulatory Visit (INDEPENDENT_AMBULATORY_CARE_PROVIDER_SITE_OTHER): Payer: Managed Care, Other (non HMO) | Admitting: Sports Medicine

## 2017-06-29 ENCOUNTER — Encounter: Payer: Self-pay | Admitting: Sports Medicine

## 2017-06-29 DIAGNOSIS — R69 Illness, unspecified: Secondary | ICD-10-CM

## 2017-06-29 DIAGNOSIS — J111 Influenza due to unidentified influenza virus with other respiratory manifestations: Secondary | ICD-10-CM | POA: Insufficient documentation

## 2017-06-29 LAB — POCT INFLUENZA A/B
Influenza A, POC: NEGATIVE
Influenza B, POC: NEGATIVE

## 2017-06-29 NOTE — Assessment & Plan Note (Signed)
4 days of symptoms, outside the window for Tamiflu. DayQuil, NyQuil, relative rest.

## 2017-06-29 NOTE — Addendum Note (Signed)
Addended by: Baird KayUGLAS, Alicyn Klann M on: 06/29/2017 04:41 PM   Modules accepted: Orders

## 2017-06-29 NOTE — Patient Instructions (Signed)
Is over-the-counter DayQuil and NyQuil for symptom control, stay hydrated, and lots of chicken soup and orange juice.

## 2017-06-29 NOTE — Progress Notes (Signed)
  Subjective:    CC: Feeling sick  HPI: For the past few days, since Saturday this pleasant 27 year old male has had fevers, chills, muscle aches, body aches, sore throat and cough.  His girlfriend had influenza.  He never presented until today.  Symptoms are mild, improving.  Worst symptom today is cough and sore throat.  Muscle aches, body aches, fevers have improved.  I reviewed the past medical history, family history, social history, surgical history, and allergies today and no changes were needed.  Please see the problem list section below in epic for further details.  Past Medical History: Past Medical History:  Diagnosis Date  . GAD (generalized anxiety disorder) 02/23/2017  . Insomnia 09/13/2015  . MDD (major depressive disorder) 02/23/2017   Past Surgical History: No past surgical history on file. Social History: Social History   Socioeconomic History  . Marital status: Single    Spouse name: None  . Number of children: None  . Years of education: None  . Highest education level: None  Social Needs  . Financial resource strain: None  . Food insecurity - worry: None  . Food insecurity - inability: None  . Transportation needs - medical: None  . Transportation needs - non-medical: None  Occupational History  . None  Tobacco Use  . Smoking status: Never Smoker  . Smokeless tobacco: Never Used  Substance and Sexual Activity  . Alcohol use: No  . Drug use: No  . Sexual activity: None  Other Topics Concern  . None  Social History Narrative  . None   Family History: Family History  Family history unknown: Yes   Allergies: No Known Allergies Medications: See med rec.  Review of Systems: No fevers, chills, night sweats, weight loss, chest pain, or shortness of breath.   Objective:    General: Well Developed, well nourished, and in no acute distress.  Neuro: Alert and oriented x3, extra-ocular muscles intact, sensation grossly intact.  HEENT: Normocephalic,  atraumatic, pupils equal round reactive to light, neck supple, no masses, no lymphadenopathy, thyroid nonpalpable.  Oropharynx, nasopharynx, ear canals unremarkable with the exception of slightly enlarged tonsils, not really erythematous and no exudates. Skin: Warm and dry, no rashes. Cardiac: Regular rate and rhythm, no murmurs rubs or gallops, no lower extremity edema.  Respiratory: Clear to auscultation bilaterally. Not using accessory muscles, speaking in full sentences.  Impression and Recommendations:    Influenza-like illness 4 days of symptoms, outside the window for Tamiflu. DayQuil, NyQuil, relative rest. ___________________________________________ Ihor Austinhomas J. Benjamin Stainhekkekandam, M.D., ABFM., CAQSM. Primary Care and Sports Medicine [facility]  Adjunct Instructor of Family Medicine  [employer]

## 2017-07-01 ENCOUNTER — Telehealth: Payer: Self-pay | Admitting: Sports Medicine

## 2017-07-01 ENCOUNTER — Encounter: Payer: Self-pay | Admitting: Sports Medicine

## 2017-07-01 NOTE — Telephone Encounter (Signed)
Patient came in stating that he is feeling much better and does not need to be out of work until Monday. He is requesting that you update his doctor's note to state he can return to work on 07/02/17. Please advise. Thanks!

## 2017-07-01 NOTE — Progress Notes (Signed)
Note is in box.

## 2017-07-12 ENCOUNTER — Encounter: Payer: Self-pay | Admitting: Family Medicine

## 2017-07-12 ENCOUNTER — Ambulatory Visit: Payer: Managed Care, Other (non HMO) | Admitting: Family Medicine

## 2017-07-12 DIAGNOSIS — Z0189 Encounter for other specified special examinations: Secondary | ICD-10-CM

## 2017-09-18 ENCOUNTER — Other Ambulatory Visit: Payer: Self-pay | Admitting: Family Medicine

## 2017-09-19 ENCOUNTER — Other Ambulatory Visit: Payer: Self-pay | Admitting: Family Medicine

## 2017-10-20 ENCOUNTER — Other Ambulatory Visit: Payer: Self-pay

## 2017-10-20 ENCOUNTER — Encounter: Payer: Self-pay | Admitting: Emergency Medicine

## 2017-10-20 ENCOUNTER — Emergency Department (INDEPENDENT_AMBULATORY_CARE_PROVIDER_SITE_OTHER)
Admission: EM | Admit: 2017-10-20 | Discharge: 2017-10-20 | Disposition: A | Discharge status: Home / Self Care | Payer: Managed Care, Other (non HMO) | Source: Home / Self Care | Attending: Family Medicine | Admitting: Family Medicine

## 2017-10-20 DIAGNOSIS — M26621 Arthralgia of right temporomandibular joint: Secondary | ICD-10-CM | POA: Diagnosis not present

## 2017-10-20 MED ORDER — PREDNISONE 20 MG PO TABS: ORAL_TABLET | ORAL | 0 refills | Status: DC

## 2017-10-20 NOTE — ED Provider Notes (Signed)
[name]    CSN: [id number] Arrival date & time: 10/20/17  1530     History   Chief Complaint Chief Complaint  Patient presents with  . Otalgia    HPI Jeff Rogers is a 27 y.o. male.   Patient developed mild right earache last night.  Today while eating lunch he heard a popping sensation in his right ear, followed by increased right ear pain.  No sinus congestion or sore throat.  No fevers, chills, and sweats.  The history is provided by the patient.    Past Medical History:  Diagnosis Date  . GAD (generalized anxiety disorder) 02/23/2017  . Insomnia 09/13/2015  . MDD (major depressive disorder) 02/23/2017    Patient Active Problem List   Diagnosis Date Noted  . Influenza-like illness 06/29/2017  . Constipation 05/10/2017  . MDD (major depressive disorder) 02/23/2017  . GAD (generalized anxiety disorder) 02/23/2017  . Insomnia 09/13/2015  . Lumbago 07/03/2015    History reviewed. No pertinent surgical history.     Home Medications    Prior to Admission medications   Medication Sig Start Date End Date Taking? Authorizing Provider  clonazePAM (KLONOPIN) 0.5 MG tablet Take 1 tablet (0.5 mg total) by mouth 2 (two) times daily as needed for anxiety. 06/04/17   Rodolph Bong, MD  FLUoxetine (PROZAC) 20 MG tablet Take 1 tablet (20 mg total) by mouth daily. 05/10/17   Rodolph Bong, MD  polyethylene glycol powder (GLYCOLAX/MIRALAX) powder Take 17 g by mouth daily. 05/10/17   Rodolph Bong, MD  predniSONE (DELTASONE) 20 MG tablet Take one tab by mouth twice daily for 4 days, then one daily. Take with food. 10/20/17   Lattie Haw, MD  zolpidem (AMBIEN) 10 MG tablet Take 1 tablet (10 mg total) by mouth at bedtime as needed. for sleep 02/23/17   Rodolph Bong, MD    Family History Family History  Family history unknown: Yes    Social History Social History   Tobacco Use  . Smoking status: Never Smoker  . Smokeless tobacco: Never Used  Substance  Use Topics  . Alcohol use: No  . Drug use: No     Allergies   Patient has no known allergies.   Review of Systems Review of Systems No sore throat No cough No pleuritic pain No wheezing No nasal congestion No post-nasal drainage No sinus pain/pressure No itchy/red eyes + right earache No hemoptysis No SOB No fever/chills No nausea No vomiting No abdominal pain No diarrhea No urinary symptoms No skin rash No fatigue No myalgias No headache    Physical Exam Triage Vital Signs ED Triage Vitals  Enc Vitals Group     BP 10/20/17 1554 116/81     Pulse Rate 10/20/17 1554 66     Resp --      Temp 10/20/17 1554 98.3 F (36.8 C)     Temp Source 10/20/17 1554 Oral     SpO2 10/20/17 1554 99 %     Weight 10/20/17 1555 196 lb (88.9 kg)     Height 10/20/17 1555  (1.727 m)     Head Circumference --      Peak Flow --      Pain Score 10/20/17 1555 8     Pain Loc --      Pain Edu? --      Excl. in GC? --    No data found.  Updated Vital Signs BP 116/81 (BP Location: Right Arm)  Pulse 66   Temp 98.3 F (36.8 C) (Oral)   Ht  (1.727 m)   Wt 196 lb (88.9 kg)   SpO2 99%   BMI 29.80 kg/m   Visual Acuity Right Eye Distance:   Left Eye Distance:   Bilateral Distance:    Right Eye Near:   Left Eye Near:    Bilateral Near:     Physical Exam Nursing notes and Vital Signs reviewed. Appearance:  Patient appears stated age, and in no acute distress Eyes:  Pupils are equal, round, and reactive to light and accomodation.  Extraocular movement is intact.  Conjunctivae are not inflamed  Ears:  Canals normal.  Tympanic membranes normal.  There is distinct tenderness over the right  temporomandibular joint.  Palpation there recreates his pain.  Nose:  Mildly congested turbinates.  No sinus tenderness.  Neck:  Supple.  No adenopathy.  Lungs:  Clear to auscultation.  Breath sounds are equal.  Moving air well. Heart:  Regular rate and rhythm without murmurs,  rubs, or gallops.  Abdomen:  Nontender Extremities:  No edema.  Skin:  No rash present.    UC Treatments / Results  Labs (all labs ordered are listed, but only abnormal results are displayed) Labs Reviewed -  Tympanometry:  Right ear tympanogram normal; Left ear tympanogram normal  EKG None  Radiology No results found.  Procedures Procedures (including critical care time)  Medications Ordered in UC Medications - No data to display  Initial Impression / Assessment and Plan / UC Course  I have reviewed the triage vital signs and the nursing notes.  Pertinent labs & imaging results that were available during my care of the patient were reviewed by me and considered in my medical decision making (see chart for details).    No evidence otitis media. Begin prednisone burst/taper. Followup with ENT if not improved 2 weeks.   Final Clinical Impressions(s) / UC Diagnoses   Final diagnoses:  Arthralgia of right temporomandibular joint     Discharge Instructions      Apply ice to the painful area. ? Put ice in a plastic bag. ? Place a towel between your skin and the bag. ? Leave the ice on for 10 to 15 minutes, 2-3 times a day.    ED Prescriptions    Medication Sig Dispense Auth. Provider   predniSONE (DELTASONE) 20 MG tablet Take one tab by mouth twice daily for 4 days, then one daily. Take with food. 12 tablet Lattie Haw, MD        Lattie Haw, MD 10/24/17 [phone]

## 2017-10-20 NOTE — Discharge Instructions (Signed)
Apply ice to the painful area. Put ice in a plastic bag. Place a towel between your skin and the bag. Leave the ice on for 10 to 15 minutes, 2-3 times a day.

## 2017-10-20 NOTE — ED Triage Notes (Signed)
Right ear pain started last night worse today, heard a pop while eating lunch today, severe pain.

## 2017-10-27 NOTE — Progress Notes (Deleted)
   Subjective:    Patient ID: Jeff Rogers, male    DOB: 18-Sep-1990, 27 y.o.   MRN: [id number]  HPI:  Jeff Rogers is here to establish as a new pt.  He is a pleasant 27 year old make. PHM: GAD, insomnia, Lumbago, MDD  Patient Care Team    Relationship Specialty Notifications Start End  Rodolph Bong, MD PCP - General Family Medicine  01/24/15     Patient Active Problem List   Diagnosis Date Noted  . Influenza-like illness 06/29/2017  . Constipation 05/10/2017  . MDD (major depressive disorder) 02/23/2017  . GAD (generalized anxiety disorder) 02/23/2017  . Insomnia 09/13/2015  . Lumbago 07/03/2015     Past Medical History:  Diagnosis Date  . GAD (generalized anxiety disorder) 02/23/2017  . Insomnia 09/13/2015  . MDD (major depressive disorder) 02/23/2017     No past surgical history on file.   Family History  Family history unknown: Yes     Social History   Substance and Sexual Activity  Drug Use No     Social History   Substance and Sexual Activity  Alcohol Use No     Social History   Tobacco Use  Smoking Status Never Smoker  Smokeless Tobacco Never Used     Outpatient Encounter Medications as of 10/28/2017  Medication Sig  . clonazePAM (KLONOPIN) 0.5 MG tablet Take 1 tablet (0.5 mg total) by mouth 2 (two) times daily as needed for anxiety.  Marland Kitchen FLUoxetine (PROZAC) 20 MG tablet Take 1 tablet (20 mg total) by mouth daily.  . polyethylene glycol powder (GLYCOLAX/MIRALAX) powder Take 17 g by mouth daily.  . predniSONE (DELTASONE) 20 MG tablet Take one tab by mouth twice daily for 4 days, then one daily. Take with food.  . zolpidem (AMBIEN) 10 MG tablet Take 1 tablet (10 mg total) by mouth at bedtime as needed. for sleep   No facility-administered encounter medications on file as of 10/28/2017.     Allergies: Patient has no known allergies.  There is no height or weight on file to calculate BMI.  There were no vitals taken for this  visit.    Review of Systems     Objective:   Physical Exam        Assessment & Plan:  No diagnosis found.  No problem-specific Assessment & Plan notes found for this encounter.    FOLLOW-UP:  No follow-ups on file.

## 2017-10-28 ENCOUNTER — Ambulatory Visit: Payer: Managed Care, Other (non HMO) | Admitting: Adult Health

## 2017-11-12 ENCOUNTER — Ambulatory Visit: Payer: Managed Care, Other (non HMO) | Admitting: Family Medicine

## 2017-12-11 ENCOUNTER — Emergency Department (HOSPITAL_COMMUNITY)
Admission: EM | Admit: 2017-12-11 | Discharge: 2017-12-12 | Disposition: A | Discharge status: Other Acute Inpatient Hospital | Payer: Managed Care, Other (non HMO) | Attending: Emergency Medicine | Admitting: Emergency Medicine

## 2017-12-11 ENCOUNTER — Other Ambulatory Visit: Payer: Self-pay

## 2017-12-11 DIAGNOSIS — R45851 Suicidal ideations: Secondary | ICD-10-CM | POA: Insufficient documentation

## 2017-12-11 DIAGNOSIS — F329 Major depressive disorder, single episode, unspecified: Secondary | ICD-10-CM | POA: Insufficient documentation

## 2017-12-11 DIAGNOSIS — F32A Depression, unspecified: Secondary | ICD-10-CM

## 2017-12-11 DIAGNOSIS — Z79899 Other long term (current) drug therapy: Secondary | ICD-10-CM | POA: Insufficient documentation

## 2017-12-11 LAB — COMPREHENSIVE METABOLIC PANEL
ALT: 16 U/L (ref 0–44)
ANION GAP: 7 (ref 5–15)
AST: 18 U/L (ref 15–41)
Albumin: 4.4 g/dL (ref 3.5–5.0)
Alkaline Phosphatase: 61 U/L (ref 38–126)
BUN: 11 mg/dL (ref 6–20)
CO2: 26 mmol/L (ref 22–32)
Calcium: 9.1 mg/dL (ref 8.9–10.3)
Chloride: 107 mmol/L (ref 98–111)
Creatinine, Ser: 0.74 mg/dL (ref 0.61–1.24)
Glucose, Bld: 97 mg/dL (ref 70–99)
POTASSIUM: 3.4 mmol/L — AB (ref 3.5–5.1)
Sodium: 140 mmol/L (ref 135–145)
Total Bilirubin: 0.9 mg/dL (ref 0.3–1.2)
Total Protein: 7 g/dL (ref 6.5–8.1)

## 2017-12-11 LAB — RAPID URINE DRUG SCREEN, HOSP PERFORMED
Amphetamines: NOT DETECTED
BENZODIAZEPINES: NOT DETECTED
COCAINE: NOT DETECTED
OPIATES: NOT DETECTED
Tetrahydrocannabinol: POSITIVE — AB

## 2017-12-11 LAB — CBC
HEMATOCRIT: 40.1 % (ref 39.0–52.0)
Hemoglobin: 13.8 g/dL (ref 13.0–17.0)
MCH: 30.1 pg (ref 26.0–34.0)
MCHC: 34.4 g/dL (ref 30.0–36.0)
MCV: 87.4 fL (ref 78.0–100.0)
Platelets: 289 10*3/uL (ref 150–400)
RBC: 4.59 MIL/uL (ref 4.22–5.81)
RDW: 12.5 % (ref 11.5–15.5)
WBC: 8.5 10*3/uL (ref 4.0–10.5)

## 2017-12-11 MED ORDER — ESCITALOPRAM OXALATE 10 MG PO TABS
20.0000 mg | ORAL_TABLET | Freq: Every day | ORAL | Status: DC
  Administered 2017-12-11: 20 mg via ORAL
  Filled 2017-12-11: qty 2

## 2017-12-11 MED ORDER — ZOLPIDEM TARTRATE 10 MG PO TABS
10.0000 mg | ORAL_TABLET | Freq: Every day | ORAL | Status: DC
  Administered 2017-12-11: 10 mg via ORAL
  Filled 2017-12-11: qty 1

## 2017-12-11 MED ORDER — ACETAMINOPHEN 325 MG PO TABS: 650.0000 mg | ORAL_TABLET | ORAL | Status: DC | PRN
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
## 2017-12-11 NOTE — ED Notes (Signed)
Pt had one belongings bag that was placed in locker 27.

## 2017-12-11 NOTE — ED Notes (Signed)
Pt alert x4 is seeking help for his depression today and suicidal thoughts. PA at the bedside I will continue to monitor.

## 2017-12-11 NOTE — ED Notes (Signed)
No answer when called for triage 

## 2017-12-11 NOTE — ED Notes (Signed)
Gave the pt black wrist watch to the  pt's girlfriend Cloyde Reams( Alexandra Gardner), per the pt's permission.

## 2017-12-11 NOTE — ED Notes (Signed)
EKG completed
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
## 2017-12-11 NOTE — ED Notes (Signed)
Pt fiance leaving to go home at this time, fiance did take pt belongings with her home including cell phone.

## 2017-12-11 NOTE — ED Notes (Signed)
Pt calm, laying in bed watching tv.

## 2017-12-12 ENCOUNTER — Inpatient Hospital Stay (HOSPITAL_COMMUNITY)
Admission: AD | Admit: 2017-12-12 | Discharge: 2017-12-15 | DRG: 885 | Disposition: A | Discharge status: Home / Self Care | Payer: 59 | Source: Intra-hospital | Attending: Psychiatry | Admitting: Psychiatry

## 2017-12-12 DIAGNOSIS — F5104 Psychophysiologic insomnia: Secondary | ICD-10-CM | POA: Diagnosis present

## 2017-12-12 DIAGNOSIS — F332 Major depressive disorder, recurrent severe without psychotic features: Secondary | ICD-10-CM | POA: Diagnosis present

## 2017-12-12 DIAGNOSIS — F4310 Post-traumatic stress disorder, unspecified: Secondary | ICD-10-CM | POA: Diagnosis not present

## 2017-12-12 DIAGNOSIS — G4700 Insomnia, unspecified: Secondary | ICD-10-CM

## 2017-12-12 DIAGNOSIS — F1220 Cannabis dependence, uncomplicated: Secondary | ICD-10-CM | POA: Diagnosis not present

## 2017-12-12 DIAGNOSIS — R45851 Suicidal ideations: Secondary | ICD-10-CM | POA: Diagnosis present

## 2017-12-12 DIAGNOSIS — F329 Major depressive disorder, single episode, unspecified: Secondary | ICD-10-CM | POA: Diagnosis not present

## 2017-12-12 DIAGNOSIS — F331 Major depressive disorder, recurrent, moderate: Secondary | ICD-10-CM | POA: Diagnosis not present

## 2017-12-12 DIAGNOSIS — F411 Generalized anxiety disorder: Secondary | ICD-10-CM | POA: Diagnosis present

## 2017-12-12 DIAGNOSIS — F339 Major depressive disorder, recurrent, unspecified: Secondary | ICD-10-CM | POA: Diagnosis present

## 2017-12-12 MED ORDER — HYDROXYZINE HCL 25 MG PO TABS
25.0000 mg | ORAL_TABLET | Freq: Four times a day (QID) | ORAL | Status: DC | PRN
  Administered 2017-12-14: 25 mg via ORAL
  Filled 2017-12-12: qty 1

## 2017-12-12 MED ORDER — ESCITALOPRAM OXALATE 20 MG PO TABS
40.0000 mg | ORAL_TABLET | Freq: Every day | ORAL | Status: DC
  Administered 2017-12-12 – 2017-12-15 (×4): 40 mg via ORAL
  Filled 2017-12-12 (×2): qty 2
  Filled 2017-12-12: qty 4
  Filled 2017-12-12 (×2): qty 2
  Filled 2017-12-12: qty 4
  Filled 2017-12-12 (×2): qty 2

## 2017-12-12 MED ORDER — ALUM & MAG HYDROXIDE-SIMETH 200-200-20 MG/5ML PO SUSP: 30.0000 mL | ORAL | Status: DC | PRN

## 2017-12-12 MED ORDER — MAGNESIUM HYDROXIDE 400 MG/5ML PO SUSP: 30.0000 mL | Freq: Every day | ORAL | Status: DC | PRN

## 2017-12-12 MED ORDER — ACETAMINOPHEN 325 MG PO TABS: 650.0000 mg | ORAL_TABLET | Freq: Four times a day (QID) | ORAL | Status: DC | PRN

## 2017-12-12 MED ORDER — BUSPIRONE HCL 5 MG PO TABS
5.0000 mg | ORAL_TABLET | Freq: Two times a day (BID) | ORAL | Status: DC
  Administered 2017-12-12 – 2017-12-13 (×2): 5 mg via ORAL
  Filled 2017-12-12 (×8): qty 1

## 2017-12-12 MED ORDER — TRAZODONE HCL 50 MG PO TABS
50.0000 mg | ORAL_TABLET | Freq: Every day | ORAL | Status: DC
  Administered 2017-12-12 – 2017-12-14 (×3): 50 mg via ORAL
  Filled 2017-12-12 (×7): qty 1

## 2017-12-12 MED ORDER — ZOLPIDEM TARTRATE 5 MG PO TABS
5.0000 mg | ORAL_TABLET | Freq: Every evening | ORAL | Status: DC | PRN
  Administered 2017-12-12: 5 mg via ORAL
  Filled 2017-12-12: qty 1

## 2017-12-12 NOTE — ED Notes (Signed)
[facility] Expected date:  Expected time:  Means of arrival:  Comments: [place]

## 2017-12-12 NOTE — ED Notes (Signed)
SBAR Report received from previous nurse. Pt received calm and visible on unit. Pt very sleepy and no valid assessment of current SI/ HI, A/V H, depression, anxiety, or pain can be obtained  at this time but pt  appears otherwise stable and free of distress. Pt reminded of camera surveillance, q 15 min rounds, and rules of the milieu. Will continue to assess.

## 2017-12-12 NOTE — Tx Team (Signed)
Initial Treatment Plan 12/12/2017 8:00 PM Jeff Colandy Alfredo [id number]    PATIENT STRESSORS: Medication change or noncompliance Substance abuse Traumatic event   PATIENT STRENGTHS: Ability for insight Average or above average intelligence Capable of independent living Communication skills General fund of knowledge Motivation for treatment/growth Physical Health Supportive family/friends Work skills   PATIENT IDENTIFIED PROBLEMS: "I've been really depressed for a while and I'd like some help"    "I'm dealing with a lot of anxiety"                 DISCHARGE CRITERIA:  Ability to meet basic life and health needs Adequate post-discharge living arrangements Improved stabilization in mood, thinking, and/or behavior Motivation to continue treatment in a less acute level of care Need for constant or close observation no longer present Reduction of life-threatening or endangering symptoms to within safe limits Verbal commitment to aftercare and medication compliance  PRELIMINARY DISCHARGE PLAN: Attend PHP/IOP Return to previous living arrangement Return to previous work or school arrangements  PATIENT/FAMILY INVOLVEMENT: This treatment plan has been presented to and reviewed with the patient, Jeff Rogers.  The patient and family have been given the opportunity to ask questions and make suggestions.  Altamease Oilerrainor, Pola Furno Susan, RN 12/12/2017, 8:00 PM

## 2017-12-12 NOTE — ED Notes (Signed)
Pt talking on hallway phone.  
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
## 2017-12-12 NOTE — Progress Notes (Signed)
Adult Psychoeducational Group Note  Date:  12/12/2017 Time:  10:11 PM  Group Topic/Focus:  Wrap-Up Group:   The focus of this group is to help patients review their daily goal of treatment and discuss progress on daily workbooks.  Participation Level:  Active  Participation Quality:  Appropriate  Affect: Appropriate  Cognitive:  Appropriate  Insight: Appropriate  Engagement in Group:  Engaged  Modes of Intervention:  Discussion   Additional Comments:  Pt rated the day at 2/10 due to not wanting to be here.  Akyla Vavrek 12/12/2017, 10:11 PM
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
## 2017-12-12 NOTE — BHH Counselor (Signed)
Disposition:  [facility], Donell SievertSpencer Simon, PA who recommends inpatient treatment.  [facility], Dr. Adela LankFloyd of recommended disposition.  [facility] but will have to wait until day shift AC find a bed for patient before [facility].  Tierra Thoma L. Chairty Toman, MS, LPC, [facility] Therapeutic Triage Specialist  [phone]
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
## 2017-12-12 NOTE — ED Notes (Signed)
Visitor at bedside.
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
## 2017-12-12 NOTE — ED Notes (Signed)
[facility] MD order. Pt signed e-signature. Ambulatory off unit.

## 2017-12-13 LAB — TSH: TSH: 0.77 u[IU]/mL (ref 0.350–4.500)

## 2017-12-13 MED ORDER — BUSPIRONE HCL 10 MG PO TABS
10.0000 mg | ORAL_TABLET | Freq: Two times a day (BID) | ORAL | Status: DC
Start: 2017-12-13 — End: 2017-12-15
  Administered 2017-12-13 – 2017-12-15 (×4): 10 mg via ORAL
  Filled 2017-12-13 (×8): qty 1

## 2017-12-13 NOTE — Plan of Care (Signed)
Patient verbalizes understanding of information, education provided. 

## 2017-12-13 NOTE — Progress Notes (Signed)
Patient ID: Jeff Rogers, male   DOB: 02/25/1991, 27 y.o.   MRN: [id number] D: Patient in room on approach. Pt reports feeling anxious about been here because he  misses his cat "ACH". Pt reports his relationship with his girlfriend has become toxic that he is not sure it will last. Pt reports his goal before discharge is to learn to make himself happy and get some sleep. Pt denies SI/HI/AVH and pain. Pt attended and participated in evening wrap up group. Cooperative with assessment.  A: Medications administered as prescribed. Support and encouragement provided to attend groups and engage in milieu. Pt encouraged to discuss feelings and come to staff with any question or concerns.  R: Patient remains safe and complaint with medications.

## 2017-12-13 NOTE — Tx Team (Signed)
Interdisciplinary Treatment and Diagnostic Plan Update  12/13/2017 Time of Session: 0830AM Jeff Rogers MRN: [id number]  Principal Diagnosis: <principal problem not specified>  Secondary Diagnoses: Active Problems:   Generalized anxiety disorder   Major depression, recurrent (HCC)   Moderate cannabis use disorder (HCC)   Posttraumatic stress disorder   Current Medications:  Current Facility-Administered Medications  Medication Dose Route Frequency Provider Last Rate Last Dose  . acetaminophen (TYLENOL) tablet 650 mg  650 mg Oral Q6H PRN Antonieta Pertlary, Greg Lawson, MD      . alum & mag hydroxide-simeth (MAALOX/MYLANTA) 200-200-20 MG/5ML suspension 30 mL  30 mL Oral Q4H PRN Antonieta Pertlary, Greg Lawson, MD      . busPIRone (BUSPAR) tablet 10 mg  10 mg Oral BID Antonieta Pertlary, Greg Lawson, MD      . escitalopram (LEXAPRO) tablet 40 mg  40 mg Oral Daily Antonieta Pertlary, Greg Lawson, MD   40 mg at 12/13/17 [id number]  . hydrOXYzine (ATARAX/VISTARIL) tablet 25 mg  25 mg Oral Q6H PRN Antonieta Pertlary, Greg Lawson, MD      . magnesium hydroxide (MILK OF MAGNESIA) suspension 30 mL  30 mL Oral Daily PRN Antonieta Pertlary, Greg Lawson, MD      . traZODone (DESYREL) tablet 50 mg  50 mg Oral QHS Antonieta Pertlary, Greg Lawson, MD   50 mg at 12/12/17 2133   PTA Medications: Medications Prior to Admission  Medication Sig Dispense Refill Last Dose  . clonazePAM (KLONOPIN) 0.5 MG tablet Take 1 tablet (0.5 mg total) by mouth 2 (two) times daily as needed for anxiety. (Patient not taking: Reported on 12/11/2017) 30 tablet 0 Completed Course at Unknown time  . escitalopram (LEXAPRO) 20 MG tablet Take 20 mg by mouth at bedtime.    12/10/2017 at Unknown time  . FLUoxetine (PROZAC) 20 MG tablet Take 1 tablet (20 mg total) by mouth daily. (Patient not taking: Reported on 12/11/2017) 90 tablet 3 Completed Course at Unknown time  . polyethylene glycol powder (GLYCOLAX/MIRALAX) powder Take 17 g by mouth daily. (Patient not taking: Reported on 12/11/2017) 850 g 1 Completed Course at Unknown time   . predniSONE (DELTASONE) 20 MG tablet Take one tab by mouth twice daily for 4 days, then one daily. Take with food. (Patient not taking: Reported on 12/11/2017) 12 tablet 0 Completed Course at Unknown time  . zolpidem (AMBIEN CR) 12.5 MG CR tablet Take 12.5 mg by mouth at bedtime.    12/11/2017 at Unknown time  . zolpidem (AMBIEN) 10 MG tablet Take 1 tablet (10 mg total) by mouth at bedtime as needed. for sleep (Patient not taking: Reported on 12/11/2017) 30 tablet 5 Not Taking at Unknown time    Patient Stressors: Medication change or noncompliance Substance abuse Traumatic event  Patient Strengths: Ability for insight Average or above average intelligence Capable of independent living Communication skills General fund of knowledge Motivation for treatment/growth Physical Health Supportive family/friends Work skills  Treatment Modalities: Medication Management, Group therapy, Case management,  1 to 1 session with clinician, Psychoeducation, Recreational therapy.   Physician Treatment Plan for Primary Diagnosis: <principal problem not specified> Long Term Goal(s): Improvement in symptoms so as ready for discharge Improvement in symptoms so as ready for discharge   Short Term Goals: Ability to identify changes in lifestyle to reduce recurrence of condition will improve Ability to verbalize feelings will improve Ability to disclose and discuss suicidal ideas Ability to demonstrate self-control will improve Ability to identify and develop effective coping behaviors will improve Ability to identify changes in lifestyle to  reduce recurrence of condition will improve Ability to verbalize feelings will improve Ability to disclose and discuss suicidal ideas Ability to demonstrate self-control will improve Ability to identify and develop effective coping behaviors will improve  Medication Management: Evaluate patient's response, side effects, and tolerance of medication  regimen.  Therapeutic Interventions: 1 to 1 sessions, Unit Group sessions and Medication administration.  Evaluation of Outcomes: Progressing  Physician Treatment Plan for Secondary Diagnosis: Active Problems:   Generalized anxiety disorder   Major depression, recurrent (HCC)   Moderate cannabis use disorder (HCC)   Posttraumatic stress disorder  Long Term Goal(s): Improvement in symptoms so as ready for discharge Improvement in symptoms so as ready for discharge   Short Term Goals: Ability to identify changes in lifestyle to reduce recurrence of condition will improve Ability to verbalize feelings will improve Ability to disclose and discuss suicidal ideas Ability to demonstrate self-control will improve Ability to identify and develop effective coping behaviors will improve Ability to identify changes in lifestyle to reduce recurrence of condition will improve Ability to verbalize feelings will improve Ability to disclose and discuss suicidal ideas Ability to demonstrate self-control will improve Ability to identify and develop effective coping behaviors will improve     Medication Management: Evaluate patient's response, side effects, and tolerance of medication regimen.  Therapeutic Interventions: 1 to 1 sessions, Unit Group sessions and Medication administration.  Evaluation of Outcomes: Progressing   RN Treatment Plan for Primary Diagnosis: <principal problem not specified> Long Term Goal(s): Knowledge of disease and therapeutic regimen to maintain health will improve  Short Term Goals: Ability to remain free from injury will improve, Ability to verbalize frustration and anger appropriately will improve, Ability to demonstrate self-control and Ability to participate in decision making will improve  Medication Management: RN will administer medications as ordered by provider, will assess and evaluate patient's response and provide education to patient for prescribed  medication. RN will report any adverse and/or side effects to prescribing provider.  Therapeutic Interventions: 1 on 1 counseling sessions, Psychoeducation, Medication administration, Evaluate responses to treatment, Monitor vital signs and CBGs as ordered, Perform/monitor CIWA, COWS, AIMS and Fall Risk screenings as ordered, Perform wound care treatments as ordered.  Evaluation of Outcomes: Progressing   LCSW Treatment Plan for Primary Diagnosis: <principal problem not specified> Long Term Goal(s): Safe transition to appropriate next level of care at discharge, Engage patient in therapeutic group addressing interpersonal concerns.  Short Term Goals: Engage patient in aftercare planning with referrals and resources, Increase emotional regulation and Facilitate acceptance of mental health diagnosis and concerns  Therapeutic Interventions: Assess for all discharge needs, 1 to 1 time with Social worker, Explore available resources and support systems, Assess for adequacy in community support network, Educate family and significant other(s) on suicide prevention, Complete Psychosocial Assessment, Interpersonal group therapy.  Evaluation of Outcomes: Progressing   Progress in Treatment: Attending groups: Yes. Participating in groups: Yes. Taking medication as prescribed: Yes. Toleration medication: Yes. Family/Significant other contact made: No, will contact:  family member if pt consents to collateral contact.  Patient understands diagnosis: Yes. Discussing patient identified problems/goals with staff: Yes. Medical problems stabilized or resolved: Yes. Denies suicidal/homicidal ideation: Yes. Issues/concerns per patient self-inventory: No. Other: n/a   New problem(s) identified: No, Describe:  n/a  New Short Term/Long Term Goal(s):  medication management for mood stabilization; elimination of SI thoughts; development of comprehensive mental wellness/sobriety plan.   Patient Goals:  "I  need help with my depression and dealing with my anxiety."  Discharge Plan or Barriers: CSW assessing for appropriate referrals. [facility] pamphlet, Mobile Crisis information, provided to patient for additional community support and resources.   Reason for Continuation of Hospitalization: Anxiety Depression Medication stabilization Suicidal ideation  Estimated Length of Stay: Thursday, 12/16/17  Attendees: Patient: Jeff Rogers 12/13/2017 1:48 PM  Physician: Dr. Jola Babinski MD 12/13/2017 1:48 PM  Nursing: Rodell Perna RN; [place] RN 12/13/2017 1:48 PM  RN Care Manager:x 12/13/2017 1:48 PM  Social Worker: Corrie Mckusick LCSW 12/13/2017 1:48 PM  Recreational Therapist: x 12/13/2017 1:48 PM  Other: Gilda Crease NP; Hillery Jacks NP 12/13/2017 1:48 PM  Other:  12/13/2017 1:48 PM  Other: 12/13/2017 1:48 PM    Scribe for Treatment Team: Rona Ravens, LCSW 12/13/2017 1:48 PM

## 2017-12-13 NOTE — Progress Notes (Signed)
Pt presents with a flat affect and depressed mood. Pt expressed that he's "still depressed" and that his anxiety is under control. Pt denies SI/HI. Pt reported, ongoing insomnia as a stressor. Pt reported that he slept well last night. Pt compliant with taking meds and denies any side effects to meds.  Orders reviewed with pt. Verbal support provided. V/s and labs reviewed. Suicide risk assessment completed. Pt encouraged to attend scheduled groups. 15 minute checks performed for safety.   Pt compliant with tx plan. No concerns verbalized by pt.

## 2017-12-13 NOTE — Progress Notes (Signed)
Per review of paper chart, patient signed 72 hour RFD on 12/12/17 @1311 .

## 2017-12-13 NOTE — Progress Notes (Signed)
D: Patient observed up and visible in the milieu. Watching tv with peers, interacting appropriately with roommate in dayroom. Patient states he is hopeful he will discharge tomorrow but realizes it may be on Wednesday. Minimizing events PTA. Patient's affect animated, masked, anxious and superficial with congruent mood.  Denies pain, physical complaints.   A: Medicated per orders, no prns requested or required. Medication education provided. Level III obs in place for safety. Emotional support offered. Patient encouraged to complete Suicide Safety Plan before discharge. Encouraged to attend and participate in unit programming.    R: Patient verbalizes understanding of POC. Patient denies SI/HI/AVH and remains safe on level III obs. Will continue to monitor throughout the night.

## 2017-12-13 NOTE — Progress Notes (Signed)
[facility]  12/13/2017 1:14 PM Ruby Colandy Bigley  MRN:  [id number] Subjective: Patient is seen and examined.  Patient is a 27 year old male [facility] secondary to suicidal ideation.  He is seen in follow-up.  He stated he is doing better.  He slept well last night with the trazodone.  He stated he is less anxious and less depressed today.  He is asking for discharge.  We discussed that.  He has not signed a release of information for us to be able to talk to his therapist.  I told him that was important to corroborate some of the information.  He denied any other problems today. Principal Problem: <principal problem not specified> Diagnosis:   Patient Active Problem List   Diagnosis Date Noted  . Major depression, recurrent (HCC) [F33.9] 12/12/2017  . Moderate cannabis use disorder (HCC) [F12.20]   . Posttraumatic stress disorder [F43.10]   . Influenza-like illness [R69] 06/29/2017  . Constipation [K59.00] 05/10/2017  . MDD (major depressive disorder) [F32.9] 02/23/2017  . Generalized anxiety disorder [F41.1] 02/23/2017  . Insomnia [G47.00] 09/13/2015  . Lumbago [M54.5] 07/03/2015   Total Time spent with patient: 20 minutes  Past Psychiatric History: See admission H&P  Past Medical History:  Past Medical History:  Diagnosis Date  . GAD (generalized anxiety disorder) 02/23/2017  . Insomnia 09/13/2015  . MDD (major depressive disorder) 02/23/2017   No past surgical history on file. Family History:  Family History  Family history unknown: Yes   Family Psychiatric  History: See admission H&P Social History:  Social History   Substance and Sexual Activity  Alcohol Use No     Social History   Substance and Sexual Activity  Drug Use No    Social History   Socioeconomic History  . Marital status: Single    Spouse name: Not on file  . Number of children: Not on file  . Years of education: Not on file  . Highest education level:  Not on file  Occupational History  . Not on file  Social Needs  . Financial resource strain: Not on file  . Food insecurity:    Worry: Not on file    Inability: Not on file  . Transportation needs:    Medical: Not on file    Non-medical: Not on file  Tobacco Use  . Smoking status: Never Smoker  . Smokeless tobacco: Never Used  Substance and Sexual Activity  . Alcohol use: No  . Drug use: No  . Sexual activity: Not on file  Lifestyle  . Physical activity:    Days per week: Not on file    Minutes per session: Not on file  . Stress: Not on file  Relationships  . Social connections:    Talks on phone: Not on file    Gets together: Not on file    Attends religious service: Not on file    Active member of club or organization: Not on file    Attends meetings of clubs or organizations: Not on file    Relationship status: Not on file  Other Topics Concern  . Not on file  Social History Narrative  . Not on file   Additional Social History:                         Sleep: Good  Appetite:  Good  Current Medications: Current Facility-Administered Medications  Medication Dose Route Frequency Provider Last Rate  Last Dose  . acetaminophen (TYLENOL) tablet 650 mg  650 mg Oral Q6H PRN Antonieta Pert, MD      . alum & mag hydroxide-simeth (MAALOX/MYLANTA) 200-200-20 MG/5ML suspension 30 mL  30 mL Oral Q4H PRN Antonieta Pert, MD      . busPIRone (BUSPAR) tablet 10 mg  10 mg Oral BID Antonieta Pert, MD      . escitalopram (LEXAPRO) tablet 40 mg  40 mg Oral Daily Antonieta Pert, MD   40 mg at 12/13/17 1610  . hydrOXYzine (ATARAX/VISTARIL) tablet 25 mg  25 mg Oral Q6H PRN Antonieta Pert, MD      . magnesium hydroxide (MILK OF MAGNESIA) suspension 30 mL  30 mL Oral Daily PRN Antonieta Pert, MD      . traZODone (DESYREL) tablet 50 mg  50 mg Oral QHS Antonieta Pert, MD   50 mg at 12/12/17 2133    Lab Results:  Results for orders placed or performed  during the hospital encounter of 12/12/17 (from the past 48 hour(s))  TSH     Status: None   Collection Time: 12/13/17  6:20 AM  Result Value Ref Range   TSH 0.770 0.350 - 4.500 uIU/mL    Comment: Performed by a 3rd Generation assay with a functional sensitivity of <=0.01 uIU/mL. Performed at [facility], [address], [place], [place] [address]     Blood Alcohol level:  No results found for: [facility]  Metabolic Disorder Labs: No results found for: HGBA1C, MPG No results found for: PROLACTIN No results found for: CHOL, TRIG, HDL, CHOLHDL, VLDL, LDLCALC  Physical Findings: AIMS:  , ,  ,  ,    CIWA:    COWS:     Musculoskeletal: Strength & Muscle Tone: within normal limits Gait & Station: normal Patient leans: N/A  Psychiatric Specialty Exam: Physical Exam  Nursing note and vitals reviewed. Constitutional: He is oriented to person, place, and time. He appears well-developed and well-nourished.  HENT:  Head: Normocephalic and atraumatic.  Respiratory: Effort normal.  Neurological: He is alert and oriented to person, place, and time.    ROS  Blood pressure 130/85, pulse 73, temperature 98.7 F (37.1 C), temperature source Oral, resp. rate 15, height 5' 7.75" (1.721 m), weight 83 kg (183 lb).Body mass index is 28.03 kg/m.  General Appearance: Casual  Eye Contact:  Fair  Speech:  Normal Rate  Volume:  Normal  Mood:  Anxious  Affect:  Congruent  Thought Process:  Coherent  Orientation:  Full (Time, Place, and Person)  Thought Content:  Logical  Suicidal Thoughts:  No  Homicidal Thoughts:  No  Memory:  Immediate;   Fair Recent;   Fair Remote;   Fair  Judgement:  Intact  Insight:  Lacking  Psychomotor Activity:  Normal  Concentration:  Concentration: Fair and Attention Span: Fair  Recall:  [employer] of Knowledge:  Fair  Language:  Fair  Akathisia:  Negative  Handed:  Right  AIMS (if indicated):     Assets:  Communication Skills Desire for  Improvement Housing Leisure Time Physical Health Resilience Social Support  ADL's:  Intact  Cognition:  WNL  Sleep:  Number of Hours: 6.75     Treatment Plan Summary: Daily contact with patient to assess and evaluate symptoms and progress in treatment, Medication management and Plan Patient is seen and examined.  Patient is a 27 year old male with the above-stated past psychiatric history who was seen  in follow-up.  He is doing better today.  I am going to increase his BuSpar 10 mg p.o. twice daily.  We will continue the Lexapro 40 mg p.o. daily.  He slept well with the trazodone last night, so I am going to stop his Ambien today.  We will try and contact his therapist for collateral information, but otherwise he continues to improve.  Antonieta Pert, MD 12/13/2017, 1:14 PM

## 2017-12-13 NOTE — Progress Notes (Signed)
Recreation Therapy Notes  Date: 7.8.19 Time: 0930 Location: [address] Dayroom  Group Topic: Stress Management  Goal Area(s) Addresses:  Patient will verbalize importance of using healthy stress management.  Patient will identify positive emotions associated with healthy stress management.   Intervention: Stress Management  Activity :  Meditation.  LRT introduced the stress management technique of meditation.  LRT played a meditation on choice.  Patients were to listen and follow along as meditation played.  Education:  Stress Management, Discharge Planning.   Education Outcome: Acknowledges edcuation/In group clarification offered/Needs additional education  Clinical Observations/Feedback:  Pt did not attend group.     Charnice Zwilling, LRT/CTRS         Jeff Rogers A 12/13/2017 11:57 AM 

## 2017-12-13 NOTE — Plan of Care (Signed)
  Problem: Activity: Goal: Interest or engagement in activities will improve Outcome: Progressing Goal: Sleeping patterns will improve Outcome: Progressing   

## 2017-12-14 DIAGNOSIS — R45851 Suicidal ideations: Secondary | ICD-10-CM

## 2017-12-14 MED ORDER — TRAZODONE HCL 50 MG PO TABS
50.0000 mg | ORAL_TABLET | Freq: Once | ORAL | Status: AC
  Administered 2017-12-14: 50 mg via ORAL
  Filled 2017-12-14 (×2): qty 1

## 2017-12-14 MED ORDER — NICOTINE POLACRILEX 2 MG MT GUM
2.0000 mg | CHEWING_GUM | OROMUCOSAL | Status: DC | PRN
  Administered 2017-12-14: 2 mg via ORAL

## 2017-12-14 NOTE — Plan of Care (Signed)
D: Pt denies SI/HI/AVH. Pt is pleasant and cooperative. Pt stated he was doing a lot better. "thank you for helping me in my progress"  A: Pt was offered support and encouragement. Pt was given scheduled medications. Pt was encourage to attend groups. Q 15 minute checks were done for safety.   R:Pt attends groups and interacts well with peers and staff. Pt is taking medication. Pt has no complaints.Pt receptive to treatment and safety maintained on unit.   Problem: Education: Goal: Emotional status will improve Outcome: Progressing   Problem: Education: Goal: Mental status will improve Outcome: Progressing   Problem: Activity: Goal: Interest or engagement in activities will improve Outcome: Progressing   Problem: Coping: Goal: Ability to verbalize frustrations and anger appropriately will improve Outcome: Progressing   Problem: Safety: Goal: Periods of time without injury will increase Outcome: Progressing
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
## 2017-12-14 NOTE — Progress Notes (Signed)
D: Pt  Present with  A calm affect and pleasant mood on the unit today. Pt has been isolative, stayed in the room most of the time. Pt reported this morning that he was ready for discharge, and that he was feeling better than the day she got here. Pt reported that his depression was a 3, his hopelessness was a 3 and that his anxiety was a 7. Pt reported being negative SI/HI, no AH/VH noted. A: 15 min checks continued for patient safety. R: Pts safety maintained.

## 2017-12-14 NOTE — [facility] Group Notes (Signed)
LCSW Group Therapy Note 12/14/2017 11:27 AM  Type of Therapy and Topic: Group Therapy: Overcoming Obstacles  Participation Level: Did Not Attend  Description of Group:  In this group patients will be encouraged to explore what they see as obstacles to their own wellness and recovery. They will be guided to discuss their thoughts, feelings, and behaviors related to these obstacles. The group will process together ways to cope with barriers, with attention given to specific choices patients can make. Each patient will be challenged to identify changes they are motivated to make in order to overcome their obstacles. This group will be process-oriented, with patients participating in exploration of their own experiences as well as giving and receiving support and challenge from other group members.  Therapeutic Goals: 1. Patient will identify personal and current obstacles as they relate to admission. 2. Patient will identify barriers that currently interfere with their wellness or overcoming obstacles.  3. Patient will identify feelings, thought process and behaviors related to these barriers. 4. Patient will identify two changes they are willing to make to overcome these obstacles:   Summary of Patient Progress  Invited, chose not to attend.    Therapeutic Modalities:  Cognitive Behavioral Therapy Solution Focused Therapy Motivational Interviewing Relapse Prevention Therapy   Amna Welker LCSWA Clinical Social Worker   

## 2017-12-14 NOTE — Progress Notes (Signed)
[facility] MD Progress Note  12/14/2017 1:05 PM Jeff Rogers  MRN:  [id number] Subjective: Patient reports he is feeling " a lot better".  Reports he is particularly relieved that he slept better last night . Denies suicidal ideations. Denies medication side effects.  Objective :  I have reviewed chart notes and have met with patient.  27 year old male, single, employed, lives with roommates  admitted due to worsening depression and suicidal ideation.   Reports he is feeling " a lot better". States " I feel more hopeful".  He has been visible on unit, and noted to be interacting appropriately with peers . Denies suicidal ideations.  Denies medication side effects.  Patient reports insomnia had been a significant issue prior to admission, for which he often used Ambien. He is now off Ambien, took Trazodone last night and states " it actually worked very well, I slept great".  Labs reviewed - TSH WNL  Principal Problem:  Depression, suicidal ideations Diagnosis:   Patient Active Problem List   Diagnosis Date Noted  . Major depression, recurrent ([place]) [F33.9] 12/12/2017  . Moderate cannabis use disorder ([facility]) [F12.20]   . Posttraumatic stress disorder [F43.10]   . Influenza-like illness [R69] 06/29/2017  . Constipation [K59.00] 05/10/2017  . MDD (major depressive disorder) [F32.9] 02/23/2017  . Generalized anxiety disorder [F41.1] 02/23/2017  . Insomnia [G47.00] 09/13/2015  . Lumbago [M54.5] 07/03/2015   Total Time spent with patient: 20 minutes  Past Psychiatric History: See admission H&P  Past Medical History:  Past Medical History:  Diagnosis Date  . GAD (generalized anxiety disorder) 02/23/2017  . Insomnia 09/13/2015  . MDD (major depressive disorder) 02/23/2017   No past surgical history on file. Family History:  Family History  Family history unknown: Yes   Family Psychiatric  History: See admission H&P Social History:  Social History   Substance and Sexual Activity  Alcohol Use  No     Social History   Substance and Sexual Activity  Drug Use No    Social History   Socioeconomic History  . Marital status: Single    Spouse name: Not on file  . Number of children: Not on file  . Years of education: Not on file  . Highest education level: Not on file  Occupational History  . Not on file  Social Needs  . Financial resource strain: Not on file  . Food insecurity:    Worry: Not on file    Inability: Not on file  . Transportation needs:    Medical: Not on file    Non-medical: Not on file  Tobacco Use  . Smoking status: Never Smoker  . Smokeless tobacco: Never Used  Substance and Sexual Activity  . Alcohol use: No  . Drug use: No  . Sexual activity: Not on file  Lifestyle  . Physical activity:    Days per week: Not on file    Minutes per session: Not on file  . Stress: Not on file  Relationships  . Social connections:    Talks on phone: Not on file    Gets together: Not on file    Attends religious service: Not on file    Active member of club or organization: Not on file    Attends meetings of clubs or organizations: Not on file    Relationship status: Not on file  Other Topics Concern  . Not on file  Social History Narrative  . Not on file   Additional Social History:   Sleep:  improved   Appetite:  Good  Current Medications: Current Facility-Administered Medications  Medication Dose Route Frequency Provider Last Rate Last Dose  . acetaminophen (TYLENOL) tablet 650 mg  650 mg Oral Q6H PRN Sharma Covert, MD      . alum & mag hydroxide-simeth (MAALOX/MYLANTA) 200-200-20 MG/5ML suspension 30 mL  30 mL Oral Q4H PRN Sharma Covert, MD      . busPIRone (BUSPAR) tablet 10 mg  10 mg Oral BID Sharma Covert, MD   10 mg at 12/14/17 0827  . escitalopram (LEXAPRO) tablet 40 mg  40 mg Oral Daily Sharma Covert, MD   40 mg at 12/14/17 0827  . hydrOXYzine (ATARAX/VISTARIL) tablet 25 mg  25 mg Oral Q6H PRN Sharma Covert, MD       . magnesium hydroxide (MILK OF MAGNESIA) suspension 30 mL  30 mL Oral Daily PRN Sharma Covert, MD      . traZODone (DESYREL) tablet 50 mg  50 mg Oral QHS Sharma Covert, MD   50 mg at 12/13/17 2118    Lab Results:  Results for orders placed or performed during the hospital encounter of 12/12/17 (from the past 48 hour(s))  TSH     Status: None   Collection Time: 12/13/17  6:20 AM  Result Value Ref Range   TSH 0.770 0.350 - 4.500 uIU/mL    Comment: Performed by a 3rd Generation assay with a functional sensitivity of <=0.01 uIU/mL. Performed at [facility], [place] [address], [place], [place] [address]     Blood Alcohol level:  No results found for: [facility]  Metabolic Disorder Labs: No results found for: HGBA1C, MPG No results found for: PROLACTIN No results found for: CHOL, TRIG, HDL, CHOLHDL, VLDL, LDLCALC  Physical Findings: AIMS:  , ,  ,  ,    CIWA:    COWS:     Musculoskeletal: Strength & Muscle Tone: within normal limits Gait & Station: normal Patient leans: N/A  Psychiatric Specialty Exam: Physical Exam  Nursing note and vitals reviewed. Constitutional: He is oriented to person, place, and time. He appears well-developed and well-nourished.  HENT:  Head: Normocephalic and atraumatic.  Respiratory: Effort normal.  Neurological: He is alert and oriented to person, place, and time.    ROS denies headache, no chest pain, no shortness of breath, no vomiting   Blood pressure 130/85, pulse 73, temperature 98.7 F (37.1 C), temperature source Oral, resp. rate 15, height 5' 7.75" (1.721 m), weight 83 kg (183 lb).Body mass index is 28.03 kg/m.  General Appearance: Fairly Groomed  Eye Contact:  Good  Speech:  Normal Rate  Volume:  Normal  Mood:  " a lot better"   Affect:  reactive, smiles at times appropriately, vaguely anxious   Thought Process:  Linear and Descriptions of Associations: Intact  Orientation:  Full (Time, Place, and Person)   Thought Content:  no hallucinations, no delusions, not internally preoccupied   Suicidal Thoughts:  No denies any current suicidal or self injurious ideations, denies any homicidal or violent ideations  Homicidal Thoughts:  No  Memory:  recent and remote grossly intact   Judgement:  Fair- improving   Insight:  improving   Psychomotor Activity:  Normal  Concentration:  Concentration: Good and Attention Span: Good  Recall:  Good  Fund of Knowledge:  Good  Language:  Good  Akathisia:  Negative  Handed:  Right  AIMS (if indicated):     Assets:  Communication Skills  Desire for Improvement Housing Leisure Time Physical Health Resilience Social Support  ADL's:  Intact  Cognition:  WNL  Sleep:  Number of Hours: 6.75    Assessment - patient reports he is feeling better, and in particular describes improved sleep as well as improving mood, denies SI. Visible on unit, interacting appropriately with peers . Currently on Lexapro, Buspar, which he had been taking prior to admission, and is now on Trazodone for insomnia as well, denies side effects thus far .  Treatment Plan Summary: Treatment Plan reviewed as below today 7/9  Continue to encourage group and milieu participation  Continue Lexapro 40 mgrs QDAY for depression, anxiety Continue Buspar 10 mgrs BID for anxiety Continue Vistaril 25 mgrs Q 6 hours PRN for anxiety Continue Trazodone 50 mgrs QHS for insomnia    Jenne Campus, MD 12/14/2017, 1:05 PM   Patient ID: Acy Orsak, male   DOB: Sep 20, 1990, 27 y.o.   MRN: [id number]

## 2017-12-14 NOTE — [facility] Suicide Risk Assessment (Addendum)
[facility] INPATIENT:  Family/Significant Other Suicide Prevention Education  Suicide Prevention Education:  Contact Attempts: Cherly Andersonlexandra Garner, roommate, [phone], has been identified by the patient as the family member/significant other with whom the patient will be residing, and identified as the person(s) who will aid the patient in the event of a mental health crisis.  With written consent from the patient, two attempts were made to provide suicide prevention education, prior to and/or following the patient's discharge.  We were unsuccessful in providing suicide prevention education.  A suicide education pamphlet was given to the patient to share with family/significant other.  Date and time of first attempt:12/14/17, 1522 Date and time of second attempt:12/15/17, 0945  [name], Jeff Schooler Jon, LCSW         12/14/2017, 3:22 PM

## 2017-12-15 ENCOUNTER — Encounter (HOSPITAL_COMMUNITY): Payer: Self-pay | Admitting: Behavioral Health

## 2017-12-15 MED ORDER — TRAZODONE HCL 50 MG PO TABS: 50.0000 mg | ORAL_TABLET | Freq: Every day | ORAL | 0 refills | Status: DC

## 2017-12-15 MED ORDER — BUSPIRONE HCL 10 MG PO TABS: 10.0000 mg | ORAL_TABLET | Freq: Two times a day (BID) | ORAL | 0 refills | Status: DC

## 2017-12-15 MED ORDER — HYDROXYZINE HCL 25 MG PO TABS: 25.0000 mg | ORAL_TABLET | Freq: Four times a day (QID) | ORAL | 0 refills | Status: DC | PRN

## 2017-12-15 MED ORDER — ESCITALOPRAM OXALATE 20 MG PO TABS: 40.0000 mg | ORAL_TABLET | Freq: Every day | ORAL | 0 refills | Status: DC
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
## 2017-12-15 NOTE — Progress Notes (Signed)
D:  Patient denied SI and HI, contracts for safety.   Denied A/V hallucinations.  Denied pain. A:  Medications administered per MD Orders.  Emotional support and encouragement given patient. R:   Safety maintained with 15 minute checks.    

## 2017-12-15 NOTE — [facility] Suicide Risk Assessment (Signed)
[facility] Discharge Suicide Risk Assessment   Principal Problem: Depression Discharge Diagnoses:  Patient Active Problem List   Diagnosis Date Noted  . Major depression, recurrent (HCC) [F33.9] 12/12/2017  . Moderate cannabis use disorder (HCC) [F12.20]   . Posttraumatic stress disorder [F43.10]   . Influenza-like illness [R69] 06/29/2017  . Constipation [K59.00] 05/10/2017  . MDD (major depressive disorder) [F32.9] 02/23/2017  . Generalized anxiety disorder [F41.1] 02/23/2017  . Insomnia [G47.00] 09/13/2015  . Lumbago [M54.5] 07/03/2015    Total Time spent with patient: 30 minutes  Musculoskeletal: Strength & Muscle Tone: within normal limits Gait & Station: normal Patient leans: N/A  Psychiatric Specialty Exam: ROS no headache, no visual disturbances, no sedation, no chest pain, no shortness of breath, no rash   Blood pressure 117/79, pulse 84, temperature 97.6 F (36.4 C), temperature source Oral, resp. rate 18, height 5' 7.75" (1.721 m), weight 83 kg (183 lb).Body mass index is 28.03 kg/m.  General Appearance: Well Groomed  Eye Contact::  Good  Speech:  Normal Rate409  Volume:  Normal  Mood:  denies depression, states he feels " a lot better", presents euthymic  Affect:  Appropriate  Thought Process:  Linear and Descriptions of Associations: Intact  Orientation:  Full (Time, Place, and Person)  Thought Content:  no hallucinations, no delusions, not internally preoccupied   Suicidal Thoughts:  No denies any suicidal or self injurious ideations, no homicidal or violent ideations , specifically also denies any violent or homicidal ideations towards his ex GF   Homicidal Thoughts:  No  Memory:  recent and remote grossly intact   Judgement:  Other:  improving   Insight:  improving   Psychomotor Activity:  Normal  Concentration:  Good  Recall:  Good  Fund of Knowledge:Good  Language: Good  Akathisia:  Negative  Handed:  Right  AIMS (if indicated):     Assets:   Communication Skills Desire for Improvement Resilience  Sleep:  Number of Hours: 5.5  Cognition: WNL  ADL's:  Intact   Mental Status Per Nursing Assessment::   On Admission:  Suicidal ideation indicated by patient, Self-harm thoughts(Passive SI)  Demographic Factors:  27 year old single male, no children, lives with roommates , employed   Loss Factors: Recent break up  Historical Factors: No prior psychiatric admissions, no prior history of suicide attempts, history of depression  Risk Reduction Factors:   Employed, Living with another person, especially a relative and Positive coping skills or problem solving skills  Continued Clinical Symptoms:  At this time patient is alert, attentive, well related, denies feeling depressed and presents with full range of affect, no thought disorder, no suicidal or self injurious ideations, no homicidal or violent ideations, no hallucinations,no delusions, future oriented . Visible on unit, behavior in good control, pleasant on approach. Denies medication side effects. Patient is aware that medications can cause sedation, advised not to drive if sedated .   Cognitive Features That Contribute To Risk:  No gross cognitive deficits noted upon discharge. Is alert , attentive, and oriented x 3   Suicide Risk:  Mild:  Suicidal ideation of limited frequency, intensity, duration, and specificity.  There are no identifiable plans, no associated intent, mild dysphoria and related symptoms, good self-control (both objective and subjective assessment), few other risk factors, and identifiable protective factors, including available and accessible social support.  Follow-up Information    Oswald Hillock. Go on 12/18/2017.   Why:  Please attend your therapy appt on Saturday, 12/18/17, at 3:30pm. Contact  information: [address]  [place], [place] [id number]  P: [phone]  No fax.       Dr Jackquline BerlinIzediuno. Go on 12/22/2017.   Why:  Please  attend your medication appt on Wednesday, 12/22/17, at 2:00pm. Contact information: [address] [place], [place] [address] P: [phone] F: [phone]          Plan Of Care/Follow-up recommendations:  Activity:  as tolerated  Diet:  regular Tests:  NA Other:  See below  Patient expressing readiness for discharge, and no current grounds for involuntary commitment . States he is returning home, roommate is picking him up later today Plans to follow up as above  Craige CottaFernando A Tiffiny Worthy, MD 12/15/2017, 2:28 PM

## 2017-12-15 NOTE — Progress Notes (Signed)
  [facility]  Will you be returning to the same living situation after discharge:  Yes,  with roomate At discharge, do you have transportation home?: Yes,  roomate Do you have the ability to pay for your medications: Yes,  [employer]  Release of information consent forms completed and in the chart;  Patient's signature needed at discharge.  Patient to Follow up at: Follow-up Information    Jeff Rogers. Go on 12/18/2017.   Why:  Please attend your therapy appt on Saturday, 12/18/17, at 3:30pm. Contact information: [address]  [place], [place] [id number]  P: [phone]  No fax.       Dr Jackquline BerlinIzediuno. Go on 12/22/2017.   Why:  Please attend your medication appt on Wednesday, 12/22/17, at 2:00pm. Contact information: [address] [place], [place] [address] P: [phone] F: [phone]          Next level of care provider has access to [facility] Link:no  Safety Planning and Suicide Prevention discussed: Yes,  with roomate  Have you used any form of tobacco in the last 30 days? (Cigarettes, Smokeless Tobacco, Cigars, and/or Pipes): No  Has patient been referred to the Quitline?: N/A patient is not a smoker  Patient has been referred for addiction treatment: Pt. refused referral  [name], Marvion Bastidas Jon, LCSW 12/15/2017, 11:55 AM

## 2017-12-15 NOTE — Progress Notes (Signed)
Recreation Therapy Notes  Date: 7.10.19 Time: 0930 Location: [address] Dayroom  Group Topic: Stress Management  Goal Area(s) Addresses:  Patient will verbalize importance of using healthy stress management.  Patient will identify positive emotions associated with healthy stress management.   Intervention: Stress Management  Activity :  Guided Imagery.  LRT introduced the stress management technique of guided imagery.  LRT read a script to allow patients to visualize being outside on a bright summer day.  Patients were to follow along as script was read.  Education:  Stress Management, Discharge Planning.   Education Outcome: Acknowledges edcuation/In group clarification offered/Needs additional education  Clinical Observations/Feedback: Pt did not attend group.    Hasani Diemer, LRT/CTRS          Carolee Channell A 12/15/2017 12:06 PM 

## 2017-12-15 NOTE — Progress Notes (Signed)
Discharge Note:  Patient discharged home.  Patient denied SI and HI.  Denied A/V hallucinations.  Suicide prevention information given and discussed with patient who stated he understood and had no questions.  Patient stated he received all his belongings, clothing, toiletries, misc items, etc.  Patient stated he appreciated all assistance received from [facility] staff.  All required discharge information given to patient at discharge.  

## 2017-12-15 NOTE — [facility] Suicide Risk Assessment (Signed)
[facility] INPATIENT:  Family/Significant Other Suicide Prevention Education  Suicide Prevention Education:  Education Completed; Jeff Rogers, roommate, [phone], has been identified by the patient as the family member/significant other with whom the patient will be residing, and identified as the person(s) who will aid the patient in the event of a mental health crisis (suicidal ideations/suicide attempt).  With written consent from the patient, the family member/significant other has been provided the following suicide prevention education, prior to the and/or following the discharge of the patient.  The suicide prevention education provided includes the following:  Suicide risk factors  Suicide prevention and interventions  National Suicide Hotline telephone number  [facility] assessment telephone number  [facility] Emergency Assistance 911  [facility] and/or Residential Mobile Crisis Unit telephone number  Request made of family/significant other to:  Remove weapons (e.g., guns, rifles, knives), all items previously/currently identified as safety concern.  One gun in the home. Jeff Councilmanlexandra reports she has gotten rid of ammunition and "hidden the magazine" and will dispose of the gun.  CSW encouraged her to do so before pt comes home today.  Remove drugs/medications (over-the-counter, prescriptions, illicit drugs), all items previously/currently identified as a safety concern.  The family member/significant other verbalizes understanding of the suicide prevention education information provided.  The family member/significant other agrees to remove the items of safety concern listed above.  Jeff Councilmanlexandra said she is pt fiancee, did visit him last night: "he has made a lot of improvements."  She reports she was not aware before how serious his depression was and she will be more vigilant on checking in with him moving forward.  She is in agreement that he be discharged  today.    Jeff Rogers, Jeff Patchen Jon, LCSW 12/15/2017, 9:56 AM

## 2019-06-16 ENCOUNTER — Other Ambulatory Visit: Payer: Self-pay

## 2019-06-16 ENCOUNTER — Emergency Department
Admission: EM | Admit: 2019-06-16 | Discharge: 2019-06-16 | Disposition: A | Discharge status: Home / Self Care | Payer: Managed Care, Other (non HMO) | Source: Home / Self Care

## 2019-06-16 DIAGNOSIS — R509 Fever, unspecified: Secondary | ICD-10-CM

## 2019-06-16 DIAGNOSIS — J069 Acute upper respiratory infection, unspecified: Secondary | ICD-10-CM

## 2019-06-16 NOTE — Discharge Instructions (Signed)
Your covid test is pending 

## 2019-06-16 NOTE — ED Provider Notes (Signed)
[name]    CSN: [id number] Arrival date & time: 06/16/19  1107      History   Chief Complaint Chief Complaint  Patient presents with  . Nasal Congestion  . Fever  . Sore Throat    HPI Jeff Rogers is a 29 y.o. male.   The history is provided by the patient. No language interpreter was used.  Fever Temp source:  Oral Onset quality:  Gradual Timing:  Constant Progression:  Worsening Chronicity:  New Relieved by:  Nothing Worsened by:  Nothing Associated symptoms: cough   Sore Throat Associated symptoms include shortness of breath.   Pt complains of a cough.  Pt's employer told him to get a covid test Past Medical History:  Diagnosis Date  . GAD (generalized anxiety disorder) 02/23/2017  . Insomnia 09/13/2015  . MDD (major depressive disorder) 02/23/2017    Patient Active Problem List   Diagnosis Date Noted  . Major depression, recurrent (HCC) 12/12/2017  . Moderate cannabis use disorder (HCC)   . Posttraumatic stress disorder   . Influenza-like illness 06/29/2017  . Constipation 05/10/2017  . MDD (major depressive disorder) 02/23/2017  . Generalized anxiety disorder 02/23/2017  . Insomnia 09/13/2015  . Lumbago 07/03/2015    History reviewed. No pertinent surgical history.     Home Medications    Prior to Admission medications   Medication Sig Start Date End Date Taking? Authorizing Provider  busPIRone (BUSPAR) 10 MG tablet Take 1 tablet (10 mg total) by mouth 2 (two) times daily. 12/15/17   Denzil Magnuson, NP  escitalopram (LEXAPRO) 20 MG tablet Take 2 tablets (40 mg total) by mouth daily. 12/16/17   Denzil Magnuson, NP  hydrOXYzine (ATARAX/VISTARIL) 25 MG tablet Take 1 tablet (25 mg total) by mouth every 6 (six) hours as needed for anxiety. 12/15/17   Denzil Magnuson, NP  traZODone (DESYREL) 50 MG tablet Take 1 tablet (50 mg total) by mouth at bedtime. 12/15/17   Denzil Magnuson, NP    Family History Family History  Family history  unknown: Yes    Social History Social History   Tobacco Use  . Smoking status: Never Smoker  . Smokeless tobacco: Never Used  Substance Use Topics  . Alcohol use: No  . Drug use: No     Allergies   Patient has no known allergies.   Review of Systems Review of Systems  Constitutional: Positive for fever.  Respiratory: Positive for cough and shortness of breath.   All other systems reviewed and are negative.    Physical Exam Triage Vital Signs ED Triage Vitals [06/16/19 1145]  Enc Vitals Group     BP 138/80     Pulse Rate (!) 108     Resp 18     Temp 99.1 F (37.3 C)     Temp Source Oral     SpO2 97 %     Weight 220 lb (99.8 kg)     Height 5\' 9"  (1.753 m)     Head Circumference      Peak Flow      Pain Score 0     Pain Loc      Pain Edu?      Excl. in GC?    No data found.  Updated Vital Signs BP 138/80 (BP Location: Right Arm)   Pulse (!) 108   Temp 99.1 F (37.3 C) (Oral)   Resp 18   Ht 5\' 9"  (1.753 m)   Wt 99.8 kg   SpO2  97%   BMI 32.49 kg/m   Visual Acuity Right Eye Distance:   Left Eye Distance:   Bilateral Distance:    Right Eye Near:   Left Eye Near:    Bilateral Near:     Physical Exam Vitals and nursing note reviewed.  Constitutional:      Appearance: He is well-developed.  HENT:     Head: Normocephalic and atraumatic.  Eyes:     Conjunctiva/sclera: Conjunctivae normal.  Cardiovascular:     Rate and Rhythm: Normal rate and regular rhythm.     Heart sounds: No murmur.  Pulmonary:     Effort: Pulmonary effort is normal. No respiratory distress.     Breath sounds: Normal breath sounds.  Abdominal:     Tenderness: There is no abdominal tenderness.  Musculoskeletal:     Cervical back: Neck supple.  Skin:    General: Skin is warm and dry.  Neurological:     Mental Status: He is alert.      UC Treatments / Results  Labs (all labs ordered are listed, but only abnormal results are displayed) Labs Reviewed  NOVEL  CORONAVIRUS, NAA    EKG   Radiology No results found.  Procedures Procedures (including critical care time)  Medications Ordered in UC Medications - No data to display  Initial Impression / Assessment and Plan / UC Course  I have reviewed the triage vital signs and the nursing notes.  Pertinent labs & imaging results that were available during my care of the patient were reviewed by me and considered in my medical decision making (see chart for details).     MDM  Covid ordered.  Pt advised to quarantine. Final Clinical Impressions(s) / UC Diagnoses   Final diagnoses:  Fever, unspecified  Upper respiratory tract infection, unspecified type     Discharge Instructions     Your covid test is pending   ED Prescriptions    None     PDMP not reviewed this encounter.  An After Visit Summary was printed and given to the patient.    Fransico Meadow, [place] 06/16/19 1938

## 2019-06-16 NOTE — ED Triage Notes (Signed)
Pt here today for sore throat, fever, runny nose and headache since last Friday. No known covid exposure.

## 2019-06-18 LAB — NOVEL CORONAVIRUS, NAA: SARS-CoV-2, NAA: NOT DETECTED

## 2019-07-03 ENCOUNTER — Other Ambulatory Visit: Payer: Self-pay

## 2019-07-03 ENCOUNTER — Encounter: Payer: Self-pay | Admitting: Sports Medicine

## 2019-07-03 ENCOUNTER — Ambulatory Visit (INDEPENDENT_AMBULATORY_CARE_PROVIDER_SITE_OTHER): Payer: Self-pay | Admitting: Sports Medicine

## 2019-07-03 DIAGNOSIS — M7061 Trochanteric bursitis, right hip: Secondary | ICD-10-CM | POA: Insufficient documentation

## 2019-07-03 NOTE — Progress Notes (Signed)
    Procedures performed today:    Procedure: Real-time Ultrasound Guided injection of the right greater trochanteric bursa Device: Samsung HS60  Verbal informed consent obtained.  Time-out conducted.  Noted no overlying erythema, induration, or other signs of local infection.  Skin prepped in a sterile fashion.  Local anesthesia: Topical Ethyl chloride.  With sterile technique and under real time ultrasound guidance: 1 cc Kenalog 40, 2 cc lidocaine, 2 cc bupivacaine injected easily Completed without difficulty  Pain immediately resolved suggesting accurate placement of the medication.  Advised to call if fevers/chills, erythema, induration, drainage, or persistent bleeding.  Images permanently stored and available for review in the ultrasound unit.  Impression: Technically successful ultrasound guided injection.  Independent interpretation of tests performed by another provider:   None.  Impression and Recommendations:    Greater trochanteric bursitis, right Any returns, he is very active at work, he continues to have pain in his right lateral hip with radiation to the knee, moderate, persistent. He did have an injection somewhere along his IT band back in 2016. He has pain directly over the greater trochanter to palpation, hip abductor strength is good, negative Ober's test. There is also no tenderness around the knee. Today his symptoms are referrable mostly to the greater trochanteric bursa so we performed a right greater trochanteric bursa injection. I would like updated x-rays of his low back as well as his right hip. I would like him to do some rehab exercises and return to see me in 4 to 6 weeks. If he continues to have discomfort we need to further evaluate his lumbar spine as a source of his pain.    ___________________________________________ Jeff Rogers. Benjamin Stain, M.D., ABFM., CAQSM. Primary Care and Sports Medicine [facility]  Adjunct  Instructor of Family Medicine  [employer]

## 2019-07-03 NOTE — Assessment & Plan Note (Addendum)
Any returns, he is very active at work, he continues to have pain in his right lateral hip with radiation to the knee, moderate, persistent. He did have an injection somewhere along his IT band back in 2016. He has pain directly over the greater trochanter to palpation, hip abductor strength is good, negative Ober's test. There is also no tenderness around the knee. Today his symptoms are referrable mostly to the greater trochanteric bursa so we performed a right greater trochanteric bursa injection. I would like updated x-rays of his low back as well as his right hip. I would like him to do some rehab exercises and return to see me in 4 to 6 weeks. If he continues to have discomfort we need to further evaluate his lumbar spine as a source of his pain.

## 2019-07-05 ENCOUNTER — Telehealth: Payer: Self-pay

## 2019-07-05 ENCOUNTER — Encounter: Payer: Self-pay | Admitting: Sports Medicine

## 2019-07-05 NOTE — Telephone Encounter (Signed)
Left message advising of work note.

## 2019-07-05 NOTE — Telephone Encounter (Signed)
Done

## 2019-07-05 NOTE — Telephone Encounter (Signed)
Jeff Rogers states he thought the work note stated to return on 07/05/2019. He wanted to know if the work note can be changed to return today. Please advise.

## 2019-07-05 NOTE — Progress Notes (Signed)
Letter in chart

## 2019-07-08 ENCOUNTER — Encounter (HOSPITAL_BASED_OUTPATIENT_CLINIC_OR_DEPARTMENT_OTHER): Payer: Self-pay | Admitting: *Deleted

## 2019-07-08 ENCOUNTER — Emergency Department (HOSPITAL_BASED_OUTPATIENT_CLINIC_OR_DEPARTMENT_OTHER)
Admission: EM | Admit: 2019-07-08 | Discharge: 2019-07-08 | Disposition: A | Discharge status: Home / Self Care | Payer: No Typology Code available for payment source | Attending: Emergency Medicine | Admitting: Emergency Medicine

## 2019-07-08 ENCOUNTER — Other Ambulatory Visit: Payer: Self-pay

## 2019-07-08 DIAGNOSIS — Y9289 Other specified places as the place of occurrence of the external cause: Secondary | ICD-10-CM | POA: Diagnosis not present

## 2019-07-08 DIAGNOSIS — Z79899 Other long term (current) drug therapy: Secondary | ICD-10-CM | POA: Insufficient documentation

## 2019-07-08 DIAGNOSIS — S6991XA Unspecified injury of right wrist, hand and finger(s), initial encounter: Secondary | ICD-10-CM | POA: Diagnosis present

## 2019-07-08 DIAGNOSIS — Y990 Civilian activity done for income or pay: Secondary | ICD-10-CM | POA: Diagnosis not present

## 2019-07-08 DIAGNOSIS — S60511A Abrasion of right hand, initial encounter: Secondary | ICD-10-CM | POA: Insufficient documentation

## 2019-07-08 DIAGNOSIS — Y9389 Activity, other specified: Secondary | ICD-10-CM | POA: Insufficient documentation

## 2019-07-08 DIAGNOSIS — W208XXA Other cause of strike by thrown, projected or falling object, initial encounter: Secondary | ICD-10-CM | POA: Insufficient documentation

## 2019-07-08 NOTE — ED Triage Notes (Signed)
Pt c/o injury to right hand x 3 hrs ago. Abrasion noted

## 2019-07-08 NOTE — ED Provider Notes (Addendum)
[facility] Provider Note: Jeff Dell, MD, FACEP  CSN: [id number] MRN: [id number] ARRIVAL: 07/08/19 at 0014 ROOM: [facility]   CHIEF COMPLAINT  Hand Injury   HISTORY OF PRESENT ILLNESS  07/08/19 12:39 AM Jeff Rogers is a 29 y.o. male who was at work just prior to arrival when a light fixture fell.  He put up his arm to protect his face and it struck his right hand.  He has a superficial abrasion to his right thenar eminence.  There is some mild associated numbness around the wound but otherwise no pain.  He has full use of the hand without deformity or other injury.  His tetanus is up-to-date as of last week.   Past Medical History:  Diagnosis Date  . GAD (generalized anxiety disorder) 02/23/2017  . Insomnia 09/13/2015  . MDD (major depressive disorder) 02/23/2017    History reviewed. No pertinent surgical history.  Family History  Family history unknown: Yes    Social History   Tobacco Use  . Smoking status: Never Smoker  . Smokeless tobacco: Never Used  Substance Use Topics  . Alcohol use: No  . Drug use: No    Prior to Admission medications   Medication Sig Start Date End Date Taking? Authorizing Provider  lurasidone (LATUDA) 40 MG TABS tablet Take 40 mg by mouth daily with breakfast.   Yes [provider]  escitalopram (LEXAPRO) 20 MG tablet Take 2 tablets (40 mg total) by mouth daily. 12/16/17   Denzil Magnuson, NP  hydrOXYzine (ATARAX/VISTARIL) 25 MG tablet Take 1 tablet (25 mg total) by mouth every 6 (six) hours as needed for anxiety. 12/15/17   Denzil Magnuson, NP    Allergies Patient has no known allergies.   REVIEW OF SYSTEMS  Negative except as noted here or in the History of Present Illness.   PHYSICAL EXAMINATION  Initial Vital Signs Blood pressure (!) 149/95, pulse 89, temperature 98.7 F (37.1 C), temperature source Oral, height 5\' 9"  (1.753 m), weight 99.8 kg, SpO2 100 %.  Examination General: Well-developed,  well-nourished male in no acute distress; appearance consistent with age of record HENT: normocephalic; atraumatic Eyes: Normal appearance Neck: supple Heart: regular rate and rhythm Lungs: clear to auscultation bilaterally Abdomen: soft; nondistended; nontender; bowel sounds present Extremities: No deformity; full range of motion Neurologic: Awake, alert and oriented; motor function intact in all extremities and symmetric; sensation intact over right hand; no facial droop Skin: Warm and dry; superficial abrasion base of right thumb:     Psychiatric: Normal mood and affect   RESULTS  Summary of this visit's results, reviewed and interpreted by myself:   EKG Interpretation  Date/Time:    Ventricular Rate:    PR Interval:    QRS Duration:   QT Interval:    QTC Calculation:   R Axis:     Text Interpretation:        Laboratory Studies: No results found for this or any previous visit (from the past 24 hour(s)). Imaging Studies: No results found.  ED COURSE and MDM  Nursing notes, initial and subsequent vitals signs, including pulse oximetry, reviewed and interpreted by myself.  Vitals:   07/08/19 0021 07/08/19 0025  BP:  (!) 149/95  Pulse:  89  Temp:  98.7 F (37.1 C)  TempSrc:  Oral  SpO2:  100%  Weight: 99.8 kg   Height: 5\' 9"  (1.753 m)    Medications - No data to display  Wound is superficial, requires only local wound  care.  PROCEDURES  Procedures   ED DIAGNOSES     ICD-10-CM   1. Work related injury  Y99.0   2. Abrasion of skin of right hand  S60.511A        Omega Durante, Jenny Reichmann, MD 07/08/19 0049    Shanon Rosser, MD 07/08/19 [phone]

## 2019-07-12 ENCOUNTER — Ambulatory Visit: Payer: Self-pay

## 2019-07-12 ENCOUNTER — Other Ambulatory Visit: Payer: Self-pay

## 2019-07-12 ENCOUNTER — Ambulatory Visit (INDEPENDENT_AMBULATORY_CARE_PROVIDER_SITE_OTHER): Payer: Self-pay | Admitting: Family Medicine

## 2019-07-12 ENCOUNTER — Encounter: Payer: Self-pay | Admitting: Family Medicine

## 2019-07-12 VITALS — BP 139/87 | HR 97 | Ht 69.0 in | Wt 221.0 lb

## 2019-07-12 DIAGNOSIS — R200 Anesthesia of skin: Secondary | ICD-10-CM | POA: Insufficient documentation

## 2019-07-12 DIAGNOSIS — M25561 Pain in right knee: Secondary | ICD-10-CM

## 2019-07-12 DIAGNOSIS — M5416 Radiculopathy, lumbar region: Secondary | ICD-10-CM

## 2019-07-12 MED ORDER — PREDNISONE 5 MG PO TABS: ORAL_TABLET | ORAL | 0 refills | Status: DC

## 2019-07-12 NOTE — Assessment & Plan Note (Signed)
Symptoms seem more radicular nature as opposed to IT band or related to the knee.  May be a component of piriformis. -Prednisone. -Counseled on home exercise therapy and supportive care. -Provided samples of Duexis. -If no improvement can consider imaging or physical therapy.

## 2019-07-12 NOTE — Progress Notes (Signed)
Medication Samples have been provided to the patient.  Drug name: Duexis       Strength: 800mg /26.6mg         Qty: 2 Boxes LOT   Exp.Date: 06/2020  Dosing instructions: Take 1 tablet by mouth three (3) times a day.  The patient has been instructed regarding the correct time, dose, and frequency of taking this medication, including desired effects and most common side effects.   07/2020, Kathi Simpers 4:30 PM 07/12/2019

## 2019-07-12 NOTE — Patient Instructions (Signed)
Nice to meet you Please try the exercises   Please try the duexis after the prednisone if still in pain  Please send me a message in MyChart with any questions or updates.  Please see me back in 4 weeks.   --Dr. Jordan Likes

## 2019-07-12 NOTE — Progress Notes (Signed)
Jeff Rogers - 29 y.o. male MRN [id number]  Date of birth: [birth year]  SUBJECTIVE:  Including CC & ROS.  Chief Complaint  Patient presents with  . Leg Pain    right IT Band    Jeff Rogers is a 29 y.o. male that is presenting with right lower leg pain.  This seems to be worse when he is doing jujitsu as well as walking a lot at work.  Pain is intermittent in nature.  He also feels abnormal sensations at night in the lateral aspect of the lower leg.  No improvement with modalities to date.  Has not tried any medications.  No injury or inciting event.   Review of Systems See HPI   HISTORY: Past Medical, Surgical, Social, and Family History Reviewed & Updated per EMR.   Pertinent Historical Findings include:  Past Medical History:  Diagnosis Date  . GAD (generalized anxiety disorder) 02/23/2017  . Insomnia 09/13/2015  . MDD (major depressive disorder) 02/23/2017    No past surgical history on file.  No Known Allergies  Family History  Family history unknown: Yes     Social History   Socioeconomic History  . Marital status: Single    Spouse name: Not on file  . Number of children: Not on file  . Years of education: Not on file  . Highest education level: Not on file  Occupational History  . Not on file  Tobacco Use  . Smoking status: Never Smoker  . Smokeless tobacco: Never Used  Substance and Sexual Activity  . Alcohol use: No  . Drug use: No  . Sexual activity: Not on file  Other Topics Concern  . Not on file  Social History Narrative  . Not on file   Social Determinants of Health   Financial Resource Strain:   . Difficulty of Paying Living Expenses: Not on file  Food Insecurity:   . Worried About Programme researcher, broadcasting/film/video in the Last Year: Not on file  . Ran Out of Food in the Last Year: Not on file  Transportation Needs:   . Lack of Transportation (Medical): Not on file  . Lack of Transportation (Non-Medical): Not on file  Physical Activity:   . Days of  Exercise per Week: Not on file  . Minutes of Exercise per Session: Not on file  Stress:   . Feeling of Stress : Not on file  Social Connections:   . Frequency of Communication with Friends and Family: Not on file  . Frequency of Social Gatherings with Friends and Family: Not on file  . Attends Religious Services: Not on file  . Active Member of Clubs or Organizations: Not on file  . Attends Banker Meetings: Not on file  . Marital Status: Not on file  Intimate Partner Violence:   . Fear of Current or Ex-Partner: Not on file  . Emotionally Abused: Not on file  . Physically Abused: Not on file  . Sexually Abused: Not on file     PHYSICAL EXAM:  VS: BP 139/87   Pulse 97   Ht 5\' 9"  (1.753 m)   Wt 221 lb (100.2 kg)   BMI 32.64 kg/m  Physical Exam Gen: NAD, alert, cooperative with exam, well-appearing ENT: normal lips, normal nasal mucosa,  Eye: normal EOM, normal conjunctiva and lids Skin: no rashes, no areas of induration  Neuro: normal tone, normal sensation to touch Psych:  normal insight, alert and oriented MSK:  Right knee: Normal range of motion.  No effusion. Normal strength resistance. Negative Noble's test. No signs of atrophy. Neurovascularly intact  Limited ultrasound: Right knee:  No effusion within the suprapatellar pouch. Normal-appearing quadricep and patellar tendon. Normal-appearing medial meniscus. Normal-appearing lateral meniscus. No changes observed over the lateral femoral condyle. Normal-appearing musculature in the lateral lower leg  Summary: Normal findings of knee and lower leg  Ultrasound and interpretation by Clearance Coots, MD    ASSESSMENT & PLAN:   Lumbar radiculopathy Symptoms seem more radicular nature as opposed to IT band or related to the knee.  May be a component of piriformis. -Prednisone. -Counseled on home exercise therapy and supportive care. -Provided samples of Duexis. -If no improvement can consider  imaging or physical therapy.

## 2019-07-20 ENCOUNTER — Ambulatory Visit (INDEPENDENT_AMBULATORY_CARE_PROVIDER_SITE_OTHER): Payer: Self-pay | Admitting: Sports Medicine

## 2019-07-20 ENCOUNTER — Encounter: Payer: Self-pay | Admitting: Sports Medicine

## 2019-07-20 ENCOUNTER — Other Ambulatory Visit: Payer: Self-pay

## 2019-07-20 DIAGNOSIS — R200 Anesthesia of skin: Secondary | ICD-10-CM

## 2019-07-20 DIAGNOSIS — R202 Paresthesia of skin: Secondary | ICD-10-CM

## 2019-07-20 DIAGNOSIS — M7061 Trochanteric bursitis, right hip: Secondary | ICD-10-CM

## 2019-07-20 NOTE — Progress Notes (Signed)
    Procedures performed today:    None.  Independent interpretation of tests performed by another provider:   None.  Impression and Recommendations:    Greater trochanteric bursitis, right Samarth returns, his right lateral hip pain has improved considerably with trochanteric bursa injection.  Numbness and tingling of right leg We have been treating Jeff Rogers for a right greater trochanteric bursitis. This is resolved after injection. He still reports symptoms in his right leg, with occasional radiating pain from deep in the buttock down the posterior thigh, and to the lateral lower leg. This pain is often worse with flexion and adduction of his hip, he also feels it with palpation over his fibular neck. He does not report much back pain now but he has had it in the past. He is also very active with jujitsu. 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 For this reason we are going to proceed with x-rays of his hip and back, as well as referral for nerve conduction study and EMG of the right lower extremity. I did inform him that the work-up could be fairly extensive, expensive, and involve an MRI, if we do not get an answer from the nerve conduction study we will probably MRI his lumbar spine, if no explanation with the lumbar spine MRI I will perform hydrodissections of the sciatic nerve at the piriformis followed by hydrodissection of the common peroneal nerve at the fibular neck if no better.    ___________________________________________ Ihor Austin. Benjamin Stain, M.D., ABFM., CAQSM. Primary Care and Sports Medicine [facility]  Adjunct Instructor of Family Medicine  [employer]

## 2019-07-20 NOTE — Patient Instructions (Signed)
There is an entrapped nerve on the right side, we are trying to determine if this is occurring in your spine (lumbar radiculitis), at your buttock (piriformis syndrome/sciatica), or at your fibular neck (peroneal nerve entrapment syndrome).  Please look these up.

## 2019-07-20 NOTE — Assessment & Plan Note (Signed)
Tiquan returns, his right lateral hip pain has improved considerably with trochanteric bursa injection.

## 2019-07-20 NOTE — Assessment & Plan Note (Signed)
We have been treating Jeff Rogers for a right greater trochanteric bursitis. This is resolved after injection. He still reports symptoms in his right leg, with occasional radiating pain from deep in the buttock down the posterior thigh, and to the lateral lower leg. This pain is often worse with flexion and adduction of his hip, he also feels it with palpation over his fibular neck. He does not report much back pain now but he has had it in the past. He is also very active with jujitsu. 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 For this reason we are going to proceed with x-rays of his hip and back, as well as referral for nerve conduction study and EMG of the right lower extremity. I did inform him that the work-up could be fairly extensive, expensive, and involve an MRI, if we do not get an answer from the nerve conduction study we will probably MRI his lumbar spine, if no explanation with the lumbar spine MRI I will perform hydrodissections of the sciatic nerve at the piriformis followed by hydrodissection of the common peroneal nerve at the fibular neck if no better.

## 2019-07-31 ENCOUNTER — Ambulatory Visit: Payer: Self-pay | Admitting: Sports Medicine

## 2019-08-29 ENCOUNTER — Other Ambulatory Visit: Payer: Self-pay

## 2019-08-29 ENCOUNTER — Ambulatory Visit (INDEPENDENT_AMBULATORY_CARE_PROVIDER_SITE_OTHER): Payer: Self-pay | Admitting: Sports Medicine

## 2019-08-29 DIAGNOSIS — R200 Anesthesia of skin: Secondary | ICD-10-CM

## 2019-08-29 DIAGNOSIS — R202 Paresthesia of skin: Secondary | ICD-10-CM

## 2019-08-29 NOTE — Progress Notes (Signed)
    Procedures performed today:    None.  Independent interpretation of notes and tests performed by another provider:   None.  Impression and Recommendations:    Numbness and tingling of right leg We have been treating Jeff Rogers for a right greater trochanteric bursitis. This is resolved after injection. He still reports symptoms in his right leg, with occasional radiating pain from deep in the buttock down the posterior thigh, and to the lateral lower leg. This pain is often worse with flexion and adduction of his hip, he also feels it with palpation over his fibular neck. He does not report much back pain now but he has had it in the past. He is also very active with jujitsu. 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 For this reason we are going to proceed with x-rays of his hip and back, as well as referral for nerve conduction study and EMG of the right lower extremity. I did inform him that the work-up could be fairly extensive, expensive, and involve an MRI, if we do not get an answer from the nerve conduction study we will probably MRI his lumbar spine, if no explanation with the lumbar spine MRI I will perform hydrodissections of the sciatic nerve at the piriformis followed by hydrodissection of the common peroneal nerve at the fibular neck if no better.  Update 08/29/2019 nerve conduction study and EMG are still not done, ordering this again. He will follow up with me regarding results.    ___________________________________________ Jeff Rogers. Jeff Rogers, M.D., ABFM., CAQSM. Primary Care and Sports Medicine [facility]  Adjunct Instructor of Family Medicine  [employer]

## 2019-08-29 NOTE — Assessment & Plan Note (Signed)
We have been treating Jeff Rogers for a right greater trochanteric bursitis. This is resolved after injection. He still reports symptoms in his right leg, with occasional radiating pain from deep in the buttock down the posterior thigh, and to the lateral lower leg. This pain is often worse with flexion and adduction of his hip, he also feels it with palpation over his fibular neck. He does not report much back pain now but he has had it in the past. He is also very active with jujitsu. 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 For this reason we are going to proceed with x-rays of his hip and back, as well as referral for nerve conduction study and EMG of the right lower extremity. I did inform him that the work-up could be fairly extensive, expensive, and involve an MRI, if we do not get an answer from the nerve conduction study we will probably MRI his lumbar spine, if no explanation with the lumbar spine MRI I will perform hydrodissections of the sciatic nerve at the piriformis followed by hydrodissection of the common peroneal nerve at the fibular neck if no better.  Update 08/29/2019 nerve conduction study and EMG are still not done, ordering this again. He will follow up with me regarding results.

## 2019-09-11 ENCOUNTER — Telehealth: Payer: Self-pay | Admitting: Sports Medicine

## 2019-09-11 NOTE — Telephone Encounter (Signed)
Letter written documenting no restrictions and okay to return to work.  He can download it from [employer].

## 2019-09-11 NOTE — Telephone Encounter (Signed)
Pt came in stating he wasn't allowed to return to work until he had a letter stating he could. He knows he can not be put on light duty until his appt on the 28th. He is requesting a regular return to work note without restrictions until his appt is completed.

## 2019-10-02 ENCOUNTER — Emergency Department (INDEPENDENT_AMBULATORY_CARE_PROVIDER_SITE_OTHER): Payer: Self-pay

## 2019-10-02 ENCOUNTER — Other Ambulatory Visit: Payer: Self-pay

## 2019-10-02 ENCOUNTER — Emergency Department
Admission: EM | Admit: 2019-10-02 | Discharge: 2019-10-02 | Disposition: A | Discharge status: Home / Self Care | Payer: Self-pay | Source: Home / Self Care

## 2019-10-02 DIAGNOSIS — J069 Acute upper respiratory infection, unspecified: Secondary | ICD-10-CM

## 2019-10-02 DIAGNOSIS — R05 Cough: Secondary | ICD-10-CM

## 2019-10-02 DIAGNOSIS — R059 Cough, unspecified: Secondary | ICD-10-CM

## 2019-10-02 DIAGNOSIS — R52 Pain, unspecified: Secondary | ICD-10-CM

## 2019-10-02 DIAGNOSIS — R509 Fever, unspecified: Secondary | ICD-10-CM

## 2019-10-02 MED ORDER — BENZONATATE 100 MG PO CAPS: 100.0000 mg | ORAL_CAPSULE | Freq: Three times a day (TID) | ORAL | 0 refills | Status: DC

## 2019-10-02 NOTE — Discharge Instructions (Signed)
  You may take 500mg acetaminophen every 4-6 hours or in combination with ibuprofen 400-600mg every 6-8 hours as needed for pain, inflammation, and fever.  Be sure to well hydrated with clear liquids and get at least 8 hours of sleep at night, preferably more while sick.   Please follow up with family medicine in 1 week if needed.   

## 2019-10-02 NOTE — ED Provider Notes (Signed)
[name]    CSN: [id number] Arrival date & time: 10/02/19  1654      History   Chief Complaint Chief Complaint  Patient presents with  . Dizziness  . Fatigue  . Cough    HPI Jeff Rogers is a 29 y.o. male.   HPI  Jeff Rogers is a 29 y.o. male presenting to UC with c/o 3 days of gradually worsening cough, congestion, mild SOB, dizziness, HA, fever Tmax 101*F two days ago.   Associated body aches and fatigue.  He has taken OTC Theraflu with mild relief. He was around his mother this weekend who also had a cough. No known exposure to Covid. No recent travel. Denies n/v/d.    Past Medical History:  Diagnosis Date  . GAD (generalized anxiety disorder) 02/23/2017  . Insomnia 09/13/2015  . MDD (major depressive disorder) 02/23/2017    Patient Active Problem List   Diagnosis Date Noted  . Numbness and tingling of right leg 07/12/2019  . Greater trochanteric bursitis, right 07/03/2019  . Major depression, recurrent (HCC) 12/12/2017  . Moderate cannabis use disorder (HCC)   . Posttraumatic stress disorder   . Constipation 05/10/2017  . MDD (major depressive disorder) 02/23/2017  . Generalized anxiety disorder 02/23/2017  . Insomnia 09/13/2015    History reviewed. No pertinent surgical history.     Home Medications    Prior to Admission medications   Medication Sig Start Date End Date Taking? Authorizing Provider  benzonatate (TESSALON) 100 MG capsule Take 1-2 capsules (100-200 mg total) by mouth every 8 (eight) hours. 10/02/19   Lurene Shadow, PA-C  escitalopram (LEXAPRO) 20 MG tablet Take 2 tablets (40 mg total) by mouth daily. 12/16/17   Denzil Magnuson, NP  hydrOXYzine (ATARAX/VISTARIL) 25 MG tablet Take 1 tablet (25 mg total) by mouth every 6 (six) hours as needed for anxiety. 12/15/17   Denzil Magnuson, NP  lurasidone (LATUDA) 40 MG TABS tablet Take 40 mg by mouth daily with breakfast.    [provider]    Family History Family History   Family history unknown: Yes    Social History Social History   Tobacco Use  . Smoking status: Current Every Day Smoker  . Smokeless tobacco: Never Used  Substance Use Topics  . Alcohol use: No  . Drug use: Yes    Types: Marijuana    Comment: twice a month     Allergies   Patient has no known allergies.   Review of Systems Review of Systems  Constitutional: Positive for appetite change (loss of appetite), chills, fatigue and fever.  HENT: Positive for congestion and sore throat. Negative for ear pain, trouble swallowing and voice change.   Respiratory: Positive for cough and shortness of breath.   Cardiovascular: Negative for chest pain and palpitations.  Gastrointestinal: Positive for nausea. Negative for abdominal pain, diarrhea and vomiting.  Musculoskeletal: Positive for arthralgias, back pain and myalgias.  Skin: Negative for rash.  Neurological: Positive for dizziness and headaches. Negative for light-headedness.  All other systems reviewed and are negative.    Physical Exam Triage Vital Signs ED Triage Vitals  Enc Vitals Group     BP 10/02/19 1721 127/89     Pulse Rate 10/02/19 1721 88     Resp 10/02/19 1721 20     Temp 10/02/19 1722 98.5 F (36.9 C)     Temp Source 10/02/19 1721 Oral     SpO2 10/02/19 1721 98 %     Weight 10/02/19 1722  211 lb (95.7 kg)     Height 10/02/19 1722 5\' 9"  (1.753 m)     Head Circumference --      Peak Flow --      Pain Score 10/02/19 1722 0     Pain Loc --      Pain Edu? --      Excl. in GC? --    No data found.  Updated Vital Signs BP 127/89 (BP Location: Left Arm)   Pulse 88   Temp 98.5 F (36.9 C) (Oral)   Resp 20   Ht 5\' 9"  (1.753 m)   Wt 211 lb (95.7 kg)   SpO2 98%   BMI 31.16 kg/m   Visual Acuity Right Eye Distance:   Left Eye Distance:   Bilateral Distance:    Right Eye Near:   Left Eye Near:    Bilateral Near:     Physical Exam Vitals and nursing note reviewed.  Constitutional:      General:  He is not in acute distress.    Appearance: Normal appearance. He is well-developed. He is not ill-appearing, toxic-appearing or diaphoretic.  HENT:     Head: Normocephalic and atraumatic.     Right Ear: Tympanic membrane and ear canal normal.     Left Ear: Tympanic membrane and ear canal normal.     Nose: Nose normal.     Right Sinus: No maxillary sinus tenderness or frontal sinus tenderness.     Left Sinus: No maxillary sinus tenderness or frontal sinus tenderness.     Mouth/Throat:     Lips: Pink.     Mouth: Mucous membranes are moist.     Pharynx: Oropharynx is clear. Uvula midline.  Cardiovascular:     Rate and Rhythm: Normal rate and regular rhythm.  Pulmonary:     Effort: Pulmonary effort is normal. No respiratory distress.     Breath sounds: Normal breath sounds. No stridor. No wheezing, rhonchi or rales.  Musculoskeletal:        General: Normal range of motion.     Cervical back: Normal range of motion and neck supple.  Skin:    General: Skin is warm and dry.  Neurological:     Mental Status: He is alert and oriented to person, place, and time.  Psychiatric:        Behavior: Behavior normal.      UC Treatments / Results  Labs (all labs ordered are listed, but only abnormal results are displayed) Labs Reviewed  NOVEL CORONAVIRUS, NAA    EKG   Radiology DG Chest 2 View  Result Date: 10/02/2019 CLINICAL DATA:  29 year-old male c/o HA, productive cough and fever x 3 days. EXAM: CHEST - 2 VIEW COMPARISON:  None. FINDINGS: The heart size and mediastinal contours are within normal limits. The lungs are clear. No pneumothorax or pleural effusion. The visualized skeletal structures are unremarkable. IMPRESSION: No active cardiopulmonary disease. Electronically Signed   By: 10/04/2019 M.D.   On: 10/02/2019 18:02    Procedures Procedures (including critical care time)  Medications Ordered in UC Medications - No data to display  Initial Impression / Assessment  and Plan / UC Course  I have reviewed the triage vital signs and the nursing notes.  Pertinent labs & imaging results that were available during my care of the patient were reviewed by me and considered in my medical decision making (see chart for details).     Reassured pt normal CXR Covid test: pending Encouraged  symptomatic tx for viral illness Work note and AVS provided  Final Clinical Impressions(s) / UC Diagnoses   Final diagnoses:  Cough  Body aches  Fever in adult  Viral upper respiratory tract infection with cough     Discharge Instructions      You may take 500mg  acetaminophen every 4-6 hours or in combination with ibuprofen 400-600mg  every 6-8 hours as needed for pain, inflammation, and fever.  Be sure to well hydrated with clear liquids and get at least 8 hours of sleep at night, preferably more while sick.   Please follow up with family medicine in 1 week if needed.     ED Prescriptions    Medication Sig Dispense Auth. Provider   benzonatate (TESSALON) 100 MG capsule Take 1-2 capsules (100-200 mg total) by mouth every 8 (eight) hours. 21 capsule Noe Gens, [place]     PDMP not reviewed this encounter.   Noe Gens, [place] 10/02/19 1859

## 2019-10-02 NOTE — ED Triage Notes (Signed)
Started Friday night with dizziness and headache.  Cough, fever, headache since sat.

## 2019-10-03 LAB — SARS-COV-2, NAA 2 DAY TAT

## 2019-10-03 LAB — NOVEL CORONAVIRUS, NAA: SARS-CoV-2, NAA: DETECTED — AB

## 2019-10-04 ENCOUNTER — Encounter: Payer: Self-pay | Admitting: Neurology

## 2019-10-04 ENCOUNTER — Telehealth: Payer: Self-pay | Admitting: *Deleted

## 2019-10-04 NOTE — Telephone Encounter (Signed)
No showed NCV/EMG appointment. 

## 2019-11-07 ENCOUNTER — Encounter: Payer: Self-pay | Admitting: Neurology

## 2019-12-06 ENCOUNTER — Encounter: Payer: Self-pay | Admitting: Neurology

## 2019-12-06 ENCOUNTER — Telehealth: Payer: Self-pay | Admitting: *Deleted

## 2019-12-06 NOTE — Telephone Encounter (Signed)
No showed NCV/EMG appointment. 

## 2020-02-05 ENCOUNTER — Other Ambulatory Visit: Payer: Self-pay

## 2020-02-05 ENCOUNTER — Emergency Department (INDEPENDENT_AMBULATORY_CARE_PROVIDER_SITE_OTHER)
Admission: RE | Admit: 2020-02-05 | Discharge: 2020-02-05 | Disposition: A | Discharge status: Home Health | Payer: Self-pay | Source: Ambulatory Visit

## 2020-02-05 ENCOUNTER — Emergency Department (INDEPENDENT_AMBULATORY_CARE_PROVIDER_SITE_OTHER): Payer: Self-pay

## 2020-02-05 VITALS — BP 117/74 | HR 72 | Temp 98.8°F | Resp 15

## 2020-02-05 DIAGNOSIS — M5441 Lumbago with sciatica, right side: Secondary | ICD-10-CM

## 2020-02-05 DIAGNOSIS — M545 Low back pain: Secondary | ICD-10-CM

## 2020-02-05 MED ORDER — CYCLOBENZAPRINE HCL 5 MG PO TABS: 5.0000 mg | ORAL_TABLET | Freq: Two times a day (BID) | ORAL | 0 refills | Status: DC | PRN
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
## 2020-02-05 NOTE — Discharge Instructions (Signed)
  Flexeril (cyclobenzaprine) is a muscle relaxer and may cause drowsiness. Do not drink alcohol, drive, or operate heavy machinery while taking.  You may take 500mg  acetaminophen every 4-6 hours or in combination with ibuprofen 400-600mg  every 6-8 hours as needed for pain and inflammation.  Call to schedule a follow up appointment with Sports Medicine later this week if not improving.
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
## 2020-03-12 ENCOUNTER — Emergency Department (INDEPENDENT_AMBULATORY_CARE_PROVIDER_SITE_OTHER)
Admission: RE | Admit: 2020-03-12 | Discharge: 2020-03-12 | Disposition: A | Discharge status: Home / Self Care | Payer: Self-pay | Source: Ambulatory Visit

## 2020-03-12 ENCOUNTER — Other Ambulatory Visit: Payer: Self-pay

## 2020-03-12 VITALS — BP 123/85 | HR 73 | Temp 98.4°F | Ht 68.0 in | Wt 210.0 lb

## 2020-03-12 DIAGNOSIS — M25561 Pain in right knee: Secondary | ICD-10-CM

## 2020-03-12 MED ORDER — MELOXICAM 15 MG PO TABS: 15.0000 mg | ORAL_TABLET | Freq: Every day | ORAL | 1 refills | Status: DC

## 2020-03-12 MED ORDER — CYCLOBENZAPRINE HCL 5 MG PO TABS: 5.0000 mg | ORAL_TABLET | Freq: Two times a day (BID) | ORAL | 0 refills | Status: DC | PRN

## 2020-03-12 NOTE — ED Provider Notes (Signed)
[name]    CSN: [id number] Arrival date & time: 03/12/20  1746      History   Chief Complaint Chief Complaint  Patient presents with  . Knee Pain    HPI Jeff Rogers is a 29 y.o. male.   HPI  Patient presents for management of right lateral leg pain that begins at the right knee and radiates down the lateral right leg. He has been evaluated by Dr. Benjamin Stain who subsequently referred patient to neurology for nerve conduction studies. In review of EMR, patient has "no showed" appointment so neurology. He left work due to leg pain and needs a work note today. Past Medical History:  Diagnosis Date  . GAD (generalized anxiety disorder) 02/23/2017  . Insomnia 09/13/2015  . MDD (major depressive disorder) 02/23/2017    Patient Active Problem List   Diagnosis Date Noted  . Numbness and tingling of right leg 07/12/2019  . Greater trochanteric bursitis, right 07/03/2019  . Major depression, recurrent (HCC) 12/12/2017  . Moderate cannabis use disorder (HCC)   . Posttraumatic stress disorder   . Constipation 05/10/2017  . MDD (major depressive disorder) 02/23/2017  . Generalized anxiety disorder 02/23/2017  . Insomnia 09/13/2015    History reviewed. No pertinent surgical history.     Home Medications    Prior to Admission medications   Medication Sig Start Date End Date Taking? Authorizing Provider  busPIRone (BUSPAR) 10 MG tablet Take by mouth. 10/26/19   [provider]  cyclobenzaprine (FLEXERIL) 5 MG tablet Take 1-2 tablets (5-10 mg total) by mouth 2 (two) times daily as needed for muscle spasms. 02/05/20   Lurene Shadow, PA-C  escitalopram (LEXAPRO) 20 MG tablet Take 2 tablets (40 mg total) by mouth daily. 12/16/17   Denzil Magnuson, NP  hydrOXYzine (ATARAX/VISTARIL) 25 MG tablet Take 1 tablet (25 mg total) by mouth every 6 (six) hours as needed for anxiety. 12/15/17   Denzil Magnuson, NP  lurasidone (LATUDA) 40 MG TABS tablet Take 40 mg by  mouth daily with breakfast.    [provider]    Family History Family History  Problem Relation Age of Onset  . Healthy Mother   . Depression Father     Social History Social History   Tobacco Use  . Smoking status: Former Smoker    Types: Cigarettes    Quit date: 10/07/2019    Years since quitting: 0.4  . Smokeless tobacco: Never Used  Substance Use Topics  . Alcohol use: No  . Drug use: Yes    Types: Marijuana    Comment: twice a month     Allergies   Patient has no known allergies.   Review of Systems Review of Systems Pertinent negatives listed in HPI Physical Exam Triage Vital Signs ED Triage Vitals  Enc Vitals Group     BP 03/12/20 1759 123/85     Pulse Rate 03/12/20 1759 73     Resp --      Temp 03/12/20 1759 98.4 F (36.9 C)     Temp Source 03/12/20 1759 Oral     SpO2 03/12/20 1759 97 %     Weight 03/12/20 1800 210 lb (95.3 kg)     Height 03/12/20 1800 5\' 8"  (1.727 m)     Head Circumference --      Peak Flow --      Pain Score 03/12/20 1800 8     Pain Loc --      Pain Edu? --  Excl. in GC? --    No data found.  Updated Vital Signs BP 123/85 (BP Location: Right Arm)   Pulse 73   Temp 98.4 F (36.9 C) (Oral)   Ht 5\' 8"  (1.727 m)   Wt 210 lb (95.3 kg)   SpO2 97%   BMI 31.93 kg/m   Visual Acuity Right Eye Distance:   Left Eye Distance:   Bilateral Distance:    Right Eye Near:   Left Eye Near:    Bilateral Near:     Physical Exam General appearance: alert, well developed, well nourished, cooperative and in no distress Head: Normocephalic, without obvious abnormality, atraumatic Respiratory: Respirations even and unlabored, normal respiratory rate Heart: rate and rhythm normal. No gallop or murmurs noted on exam  Extremities: BLE -No gross deformities, no edema, no palpable tenderness  Neurologic: GCS 15, normal gait, normal coordination  Skin: Skin color, texture, turgor normal. No rashes seen  Psych: Appropriate mood  and affect. UC Treatments / Results  Labs (all labs ordered are listed, but only abnormal results are displayed) Labs Reviewed - No data to display  EKG   Radiology No results found.  Procedures Procedures (including critical care time)  Medications Ordered in UC Medications - No data to display  Initial Impression / Assessment and Plan / UC Course  I have reviewed the triage vital signs and the nursing notes.  Pertinent labs & imaging results that were available during my care of the patient were reviewed by me and considered in my medical decision making (see chart for details).    Patient advised to follow-up with neurology by contacting their office.  He has had several no-shows and will need to reschedule his appointment.  Also schedule follow-up with Dr. .  Provided cyclobenzaprine and Mobic as needed related to pain.  Work note provided.  Red flags discussed.  AVS given.   Final Clinical Impressions(s) / UC Diagnoses   Final diagnoses:  Acute pain of right knee   Discharge Instructions   None    ED Prescriptions    Medication Sig Dispense Auth. Provider   cyclobenzaprine (FLEXERIL) 5 MG tablet Take 1-2 tablets (5-10 mg total) by mouth 2 (two) times daily as needed for muscle spasms. 20 tablet Karie Schwalbe, FNP   meloxicam (MOBIC) 15 MG tablet Take 1 tablet (15 mg total) by mouth daily. 30 tablet Bing Neighbors, FNP     PDMP not reviewed this encounter.   Bing Neighbors, Bing Neighbors 03/15/20 [phone]

## 2020-03-12 NOTE — ED Triage Notes (Signed)
Rt knee pain x 1 year

## 2020-03-14 ENCOUNTER — Ambulatory Visit: Payer: Self-pay | Admitting: Sports Medicine

## 2020-03-21 ENCOUNTER — Encounter: Payer: Self-pay | Admitting: Medical-Surgical

## 2020-03-21 ENCOUNTER — Ambulatory Visit (INDEPENDENT_AMBULATORY_CARE_PROVIDER_SITE_OTHER): Payer: Self-pay | Admitting: Medical-Surgical

## 2020-03-21 VITALS — BP 122/77 | HR 72 | Temp 97.9°F | Ht 68.0 in | Wt 214.0 lb

## 2020-03-21 DIAGNOSIS — F1220 Cannabis dependence, uncomplicated: Secondary | ICD-10-CM

## 2020-03-21 DIAGNOSIS — F411 Generalized anxiety disorder: Secondary | ICD-10-CM

## 2020-03-21 DIAGNOSIS — Z7689 Persons encountering health services in other specified circumstances: Secondary | ICD-10-CM

## 2020-03-21 DIAGNOSIS — Z1159 Encounter for screening for other viral diseases: Secondary | ICD-10-CM

## 2020-03-21 DIAGNOSIS — F331 Major depressive disorder, recurrent, moderate: Secondary | ICD-10-CM

## 2020-03-21 DIAGNOSIS — Z114 Encounter for screening for human immunodeficiency virus [HIV]: Secondary | ICD-10-CM

## 2020-03-21 MED ORDER — HYDROXYZINE HCL 50 MG PO TABS: 50.0000 mg | ORAL_TABLET | Freq: Three times a day (TID) | ORAL | 0 refills | Status: DC | PRN

## 2020-03-21 NOTE — Progress Notes (Signed)
Subjective:    CC: establish care, bipolar disorder  HPI: Pleasant 29 year old male presenting today to establish care with a new PCP and to discuss bipolar disorder.  Notes that he ran out of his psych meds a couple weeks ago.  He was off of the meds for about 1 week while he was working to get his red card and just started back on them about 1 week ago.  He is currently taking Lexapro 40 mg daily, Latuda 40 mg daily, and BuSpar 10 mg daily.  Was ordered hydroxyzine 25 mg every 6 hours as needed but reports this medication has never helped him.  Notes that the only time it did help was if he took five or 6 pills at the same time.  Over the last week, has noted increased anxiety and has difficulty relaxing.  Has noticed an improvement overall since restarting the medications but is still struggling quite a bit.  Tends to oversleep as he does not feel anxious when he is sleeping.  Missed work yesterday due to oversleeping and had to leave work early today for this appointment.  Has not followed up with psychiatry in a bit but is due to see them next week.  Denies SI/HI.  I reviewed the past medical history, family history, social history, surgical history, and allergies today and no changes were needed.  Please see the problem list section below in epic for further details.  Past Medical History: Past Medical History:  Diagnosis Date  . GAD (generalized anxiety disorder) 02/23/2017  . Insomnia 09/13/2015  . MDD (major depressive disorder) 02/23/2017   Past Surgical History: History reviewed. No pertinent surgical history. Social History: Social History   Socioeconomic History  . Marital status: Single    Spouse name: Not on file  . Number of children: Not on file  . Years of education: Not on file  . Highest education level: Not on file  Occupational History  . Not on file  Tobacco Use  . Smoking status: Former Smoker    Types: Cigarettes    Quit date: 10/07/2019    Years since quitting:  0.4  . Smokeless tobacco: Never Used  Substance and Sexual Activity  . Alcohol use: No  . Drug use: Yes    Types: Marijuana    Comment: twice a month  . Sexual activity: Not on file  Other Topics Concern  . Not on file  Social History Narrative  . Not on file   Social Determinants of Health   Financial Resource Strain:   . Difficulty of Paying Living Expenses: Not on file  Food Insecurity:   . Worried About Programme researcher, broadcasting/film/video in the Last Year: Not on file  . Ran Out of Food in the Last Year: Not on file  Transportation Needs:   . Lack of Transportation (Medical): Not on file  . Lack of Transportation (Non-Medical): Not on file  Physical Activity:   . Days of Exercise per Week: Not on file  . Minutes of Exercise per Session: Not on file  Stress:   . Feeling of Stress : Not on file  Social Connections:   . Frequency of Communication with Friends and Family: Not on file  . Frequency of Social Gatherings with Friends and Family: Not on file  . Attends Religious Services: Not on file  . Active Member of Clubs or Organizations: Not on file  . Attends Banker Meetings: Not on file  . Marital Status: Not on  file   Family History: Family History  Problem Relation Age of Onset  . Healthy Mother   . Depression Father    Allergies: No Known Allergies Medications: See med rec.  Review of Systems: See HPI for pertinent positives and negatives.   Objective:    General: Well Developed, well nourished, and in no acute distress.  Neuro: Alert and oriented x3.  HEENT: Normocephalic, atraumatic.  Skin: Warm and dry. Cardiac: Regular rate and rhythm, no murmurs rubs or gallops, no lower extremity edema.  Respiratory: Clear to auscultation bilaterally. Not using accessory muscles, speaking in full sentences.  Impression and Recommendations:    1. Encounter to establish care Reviewed available information and discussed health care concerns.  2. Generalized anxiety  disorder/depression Continue Lexapro 40 mg daily, Latuda 40 mg daily, BuSpar 10 mg daily.  We will increase dose of hydroxyzine to 50-100 mg 3 times daily as needed for anxiety.  Strongly encouraged follow-up with psychiatry as scheduled next week.  They will manage his anxiety and depression and make changes as appropriate.  3. Need for hepatitis C screening test Discussed screening recommendations.  Patient reports he has been screened previously at [facility] so we will request those records.  4. Screening for HIV (human immunodeficiency virus) Discussed many recommendations.  Patient reports he was screened for this as well at [facility] so we will request those records.  5. Moderate cannabis use disorder (HCC) Discussed continued cannabis use.  Patient reports that he is contemplating quitting and has been without use for 2 weeks.  Encourage cessation.  Discussed risks of using marijuana while taking psychiatric medications.  Return in about 3 months (around 06/21/2020) for mood follow up. ___________________________________________ Thayer Ohm, DNP, APRN, FNP-BC Primary Care and Sports Medicine [facility]

## 2020-03-25 ENCOUNTER — Encounter: Payer: Self-pay | Admitting: Medical-Surgical

## 2020-04-10 ENCOUNTER — Encounter: Payer: Self-pay | Admitting: Medical-Surgical

## 2020-04-10 ENCOUNTER — Ambulatory Visit (INDEPENDENT_AMBULATORY_CARE_PROVIDER_SITE_OTHER): Payer: Self-pay | Admitting: Medical-Surgical

## 2020-04-10 VITALS — BP 112/75 | HR 68 | Temp 97.6°F | Ht 68.0 in | Wt 218.8 lb

## 2020-04-10 DIAGNOSIS — F3177 Bipolar disorder, in partial remission, most recent episode mixed: Secondary | ICD-10-CM

## 2020-04-10 DIAGNOSIS — F1220 Cannabis dependence, uncomplicated: Secondary | ICD-10-CM

## 2020-04-10 MED ORDER — HYDROXYZINE HCL 50 MG PO TABS: 100.0000 mg | ORAL_TABLET | Freq: Three times a day (TID) | ORAL | 3 refills | Status: DC | PRN

## 2020-04-10 NOTE — Progress Notes (Signed)
Subjective:    CC: anxiety/depression follow up  HPI: Pleasant 29 year old male presenting for anxiety/depression follow up.  Reports that he was to establish care with a psychiatrist for monitoring of his medications last month but this appointment was canceled because the psychiatrist got sick.  He has not been successful in rescheduling his appointment yet.  He has been taking Lexapro 40 mg daily, Latuda 40 mg daily, BuSpar 10 mg twice daily, and hydroxyzine 100 mg 3 times daily.  Notes that for about 2 weeks he felt really good, going places and doing activities he enjoys.  Notes that he had a very good weekend as he was able to see his car for the first time in a while.  Unfortunately he ran out of hydroxyzine on Sunday.  On Monday night he woke up to go to work and had a very hard time getting up and moving.  He did not complete his shift Monday night or Tuesday night.  Reports he spent the last couple of days mostly sleeping.  During this time, he did not set alarms to get up or make himself do regular activity during the day.  Today he is feeling a little bit better and plans to go to work tonight.  Reports he is okay on his regular medicines but will need a refill on hydroxyzine.  Reports he is not smoking cannabis and has been off of it for the last 10 days.  Is doing fairly well with cessation and reports he is going to give himself at least 1 month without it. Denies SI/HI.  Of note, he reports he has a diagnosis of bipolar disorder type II although this is not in our medical record.  In care everywhere, he does have a diagnosis of bipolar disorder type I.  I reviewed the past medical history, family history, social history, surgical history, and allergies today and no changes were needed.  Please see the problem list section below in epic for further details.  Past Medical History: Past Medical History:  Diagnosis Date  . GAD (generalized anxiety disorder) 02/23/2017  . Insomnia 09/13/2015   . MDD (major depressive disorder) 02/23/2017   Past Surgical History: History reviewed. No pertinent surgical history. Social History: Social History   Socioeconomic History  . Marital status: Single    Spouse name: Not on file  . Number of children: Not on file  . Years of education: Not on file  . Highest education level: Not on file  Occupational History  . Not on file  Tobacco Use  . Smoking status: Former Smoker    Types: Cigarettes    Quit date: 10/07/2019    Years since quitting: 0.5  . Smokeless tobacco: Never Used  Substance and Sexual Activity  . Alcohol use: No  . Drug use: Yes    Types: Marijuana    Comment: twice a month  . Sexual activity: Not on file  Other Topics Concern  . Not on file  Social History Narrative  . Not on file   Social Determinants of Health   Financial Resource Strain:   . Difficulty of Paying Living Expenses: Not on file  Food Insecurity:   . Worried About Programme researcher, broadcasting/film/video in the Last Year: Not on file  . Ran Out of Food in the Last Year: Not on file  Transportation Needs:   . Lack of Transportation (Medical): Not on file  . Lack of Transportation (Non-Medical): Not on file  Physical Activity:   .  Days of Exercise per Week: Not on file  . Minutes of Exercise per Session: Not on file  Stress:   . Feeling of Stress : Not on file  Social Connections:   . Frequency of Communication with Friends and Family: Not on file  . Frequency of Social Gatherings with Friends and Family: Not on file  . Attends Religious Services: Not on file  . Active Member of Clubs or Organizations: Not on file  . Attends Banker Meetings: Not on file  . Marital Status: Not on file   Family History: Family History  Problem Relation Age of Onset  . Healthy Mother   . Depression Father    Allergies: No Known Allergies Medications: See med rec.  Review of Systems: See HPI for pertinent positives and negatives.   Depression screen [facility]  2/9 04/10/2020 03/21/2020 06/04/2017 05/10/2017 04/20/2017  Decreased Interest 1 3 3  0 2  Down, Depressed, Hopeless 3 2 3  0 1  PHQ - 2 Score 4 5 6  0 3  Altered sleeping 2 1 2  0 2  Tired, decreased energy 2 3 1  0 3  Change in appetite 2 3 2  0 3  Feeling bad or failure about yourself  1 3 1  0 2  Trouble concentrating 2 3 2  0 2  Moving slowly or fidgety/restless 0 2 1 0 2  Suicidal thoughts 0 0 2 0 0  PHQ-9 Score 13 20 17  0 17  Difficult doing work/chores Somewhat difficult Extremely dIfficult - Not difficult at all Somewhat difficult   GAD 7 : Generalized Anxiety Score 04/10/2020 03/21/2020 06/04/2017 05/10/2017  Nervous, Anxious, on Edge 1 1 2  0  Control/stop worrying 2 2 3  0  Worry too much - different things 2 2 3  0  Trouble relaxing 2 3 3 2   Restless 2 3 0 0  Easily annoyed or irritable 2 2 3  0  Afraid - awful might happen 0 3 3 0  Total GAD 7 Score 11 16 17 2   Anxiety Difficulty - Extremely difficult Very difficult Not difficult at all   Objective:    General: Well Developed, well nourished, and in no acute distress.  Neuro: Alert and oriented x3.  HEENT: Normocephalic, atraumatic.  Skin: Warm and dry. Cardiac: Regular rate and rhythm, no murmurs rubs or gallops, no lower extremity edema.  Respiratory: Clear to auscultation bilaterally. Not using accessory muscles, speaking in full sentences.   Impression and Recommendations:    1. Moderate cannabis use disorder (HCC) Encouraged continued cessation of cannabis use.  2. Bipolar disorder, in partial remission, most recent episode mixed [facility]) Discussed the nature of bipolar disorder and the switch from manic episodes to depression.  Continue current medication regimen.  Hydroxyzine refilled for 30-day supply with 3 refills.  Strongly encouraged getting rescheduled with psychiatry to manage his medications.  If he is continuing to have swings from mania to depression, he may need medication adjustment.  Return if symptoms worsen  or fail to improve. ___________________________________________ , DNP, APRN, FNP-BC Primary Care and Sports Medicine [facility] [place]

## 2020-04-15 ENCOUNTER — Encounter: Payer: Self-pay | Admitting: Medical-Surgical

## 2020-04-16 ENCOUNTER — Other Ambulatory Visit: Payer: Self-pay | Admitting: Medical-Surgical

## 2020-04-16 MED ORDER — ESCITALOPRAM OXALATE 20 MG PO TABS: 40.0000 mg | ORAL_TABLET | Freq: Every day | ORAL | 2 refills | Status: DC

## 2020-05-17 ENCOUNTER — Ambulatory Visit (INDEPENDENT_AMBULATORY_CARE_PROVIDER_SITE_OTHER): Payer: Self-pay | Admitting: Medical-Surgical

## 2020-05-17 ENCOUNTER — Encounter: Payer: Self-pay | Admitting: Medical-Surgical

## 2020-05-17 ENCOUNTER — Other Ambulatory Visit: Payer: Self-pay

## 2020-05-17 VITALS — BP 110/69 | HR 91 | Temp 97.8°F | Ht 68.0 in | Wt 223.6 lb

## 2020-05-17 DIAGNOSIS — M25561 Pain in right knee: Secondary | ICD-10-CM

## 2020-05-17 DIAGNOSIS — R202 Paresthesia of skin: Secondary | ICD-10-CM

## 2020-05-17 DIAGNOSIS — G8929 Other chronic pain: Secondary | ICD-10-CM

## 2020-05-17 DIAGNOSIS — R200 Anesthesia of skin: Secondary | ICD-10-CM

## 2020-05-17 NOTE — Progress Notes (Signed)
Subjective:    CC: Right knee issue  HPI: Pleasant 29 year old male presenting today for evaluation of his right knee. Has had chronic right knee pain since 2017. At that time, he had x-rays done which did not show any abnormalities. He participated in PT and felt that it helped a bit. Unfortunately, he started to experience numbness and tingling down the right leg from the knee to the ankle. He lost insurance and did not complete the nerve conduction study that was ordered. Today, he notes that he has had a worsening of the right knee pain. His pain is along the lateral right knee from the distal thigh to the proximal calf. The pain is worsened with movements that involve valgus/varus strain. Has been using ice and Ibuprofen as needed which are helpful. His job requires him to be on his feet for 8 hours 5 days per week as well as lift objects up to 45lbs. These long hours and repetitive lifting cause his pain to worsen. Has accommodation paperwork here with him today for completion if necessary.   I reviewed the past medical history, family history, social history, surgical history, and allergies today and no changes were needed.  Please see the problem list section below in epic for further details.  Past Medical History: Past Medical History:  Diagnosis Date  . GAD (generalized anxiety disorder) 02/23/2017  . Insomnia 09/13/2015  . MDD (major depressive disorder) 02/23/2017   Past Surgical History: History reviewed. No pertinent surgical history. Social History: Social History   Socioeconomic History  . Marital status: Single    Spouse name: Not on file  . Number of children: Not on file  . Years of education: Not on file  . Highest education level: Not on file  Occupational History  . Not on file  Tobacco Use  . Smoking status: Former Smoker    Types: Cigarettes    Quit date: 10/07/2019    Years since quitting: 0.6  . Smokeless tobacco: Never Used  Substance and Sexual Activity  .  Alcohol use: No  . Drug use: Yes    Types: Marijuana    Comment: twice a month  . Sexual activity: Not on file  Other Topics Concern  . Not on file  Social History Narrative  . Not on file   Social Determinants of Health   Financial Resource Strain: Not on file  Food Insecurity: Not on file  Transportation Needs: Not on file  Physical Activity: Not on file  Stress: Not on file  Social Connections: Not on file   Family History: Family History  Problem Relation Age of Onset  . Healthy Mother   . Depression Father    Allergies: No Known Allergies Medications: See med rec.  Review of Systems: See HPI for pertinent positives and negatives.   Objective:    General: Well Developed, well nourished, and in no acute distress.  Neuro: Alert and oriented x3.  HEENT: Normocephalic, atraumatic.  Skin: Warm and dry. Cardiac: Regular rate and rhythm, no murmurs rubs or gallops, no lower extremity edema.  Respiratory: Clear to auscultation bilaterally. Not using accessory muscles, speaking in full sentences. Right knee: Full flexion and extension with minimal lateral knee pain. Tenderness to palpation along the lateral knee joint at the area of the IT band. Pain worsened with valgus and varus motions. No joint laxity or effusion. Patellar mobility normal.   Impression and Recommendations:    1. Chronic pain of right knee Getting updated x-rays today. Suspect  IT band syndrome. Recommend continuing Ibuprofen 600mg  every 6 hours as needed. Use cold compresses if this is helpful. Encouraged stretches and exercises, included with AVS. Will complete accommodation form asap. Requested FMLA forms to complete to make sure his job is safe if he is out for appointments and treatments.  - DG Knee Complete 4 Views Right; Future  2. Numbness and tingling of right leg Reordering nerve conduction study in hopes of getting this done soon. Order printed and placed in the referral folder.  - Nerve  conduction test; Future  Return if symptoms worsen or fail to improve. ___________________________________________ , DNP, APRN, FNP-BC Primary Care and Sports Medicine [facility] [place]

## 2020-05-17 NOTE — Patient Instructions (Signed)
Iliotibial Band Syndrome  Iliotibial band syndrome (ITBS) is a condition that often causes knee pain. It can also cause pain in the outside of your hip, thigh, and knee. The iliotibial band is a strip of tissue that runs from the outside of your hip and down your thigh to the outside of your knee. Repeatedly bending and straightening your knee can irritate the iliotibial band. What are the causes? This condition is caused by inflammation and irritation from the friction of the iliotibial band moving over the thigh bone (femur) when you repeatedly bend and straighten your knee. What increases the risk? This condition is more likely to develop in people who:  Frequently change elevation during their workouts.  Run very long distances.  Recently increased the length or intensity of their workouts.  Run downhill often, or just started running downhill.  Ride a bike very far or often. You may also be at greater risk if you start a new workout routine without first warming up or if you have a job that requires you to bend, squat, or climb frequently. What are the signs or symptoms? Symptoms of this condition include:  Pain along the outside of your knee that may be worse with activity, especially running or going up and down stairs.  A "snapping" sensation over your knee.  Swelling on the outside of your knee.  Pain or a feeling of tightness in your hip. How is this diagnosed? This condition is diagnosed based on your symptoms, medical history, and physical exam. You may also see a health care provider who specializes in reducing pain and increasing mobility (physical therapist). A physical therapist may do an exam to check your balance, movement, and way of walking or running (gait) to see whether the way you move could contribute to your injury. You may also have tests to measure your strength, flexibility, and range of motion. How is this treated? Treatment for this condition  includes:  Resting and limiting exercise.  Returning to activities gradually.  Doing range-of-motion and strengthening exercises (physical therapy) as told by your health care provider.  Including low-impact activities, such as swimming, in your exercise routine. Follow these instructions at home:  If directed, apply ice to the injured area. ? Put ice in a plastic bag. ? Place a towel between your skin and the bag. ? Leave the ice on for 20 minutes, 2-3 times per day.  Return to your normal activities as told by your health care provider. Ask your health care provider what activities are safe for you.  Keep all follow-up visits with your health care provider. This is important. Contact a health care provider if:  Your pain does not improve or gets worse despite treatment. This information is not intended to replace advice given to you by your health care provider. Make sure you discuss any questions you have with your health care provider. Document Revised: 05/07/2017 Document Reviewed: 06/26/2016 Elsevier Patient Education  2020 [employer]  

## 2020-05-20 ENCOUNTER — Telehealth: Payer: Self-pay

## 2020-05-20 ENCOUNTER — Encounter: Payer: Self-pay | Admitting: Medical-Surgical

## 2020-05-20 NOTE — Telephone Encounter (Signed)
Forms for accommodations have been completed. Please print the office note from 12/10 and include with the paperwork. The noted from the 10th was sent to him in MyChart and a paper copy was provided. I have amended that to cover all three dates and resent to MyChart. A paper copy has been printed and included with the forms.

## 2020-05-20 NOTE — Telephone Encounter (Signed)
Pt called and LVM wanting to check on the status of the paperwork he brought in for completion. He states that he also needs to have the work note that was written for him during his OV on 05/17/2020 updated. He said that he lost the note after leaving from his appointment on Friday, so he stayed out of work Friday as well. He is needing to have a new updated note excusing him from being out on 05/14/2020-05/15/2020 and again on 05/17/2020.

## 2020-05-21 NOTE — Telephone Encounter (Signed)
Tried to contact pt multiple times to let him know that his paperwork and work note is ready for pick up and the phone would ring 3 times and disconnect. Will try to call pt back at a later time.

## 2020-05-22 NOTE — Telephone Encounter (Signed)
Tried to contact pt multiple times to let him know that his paperwork and work note is ready for pick up and the phone would ring 3 times and disconnect. Will try to call pt back at a later time. The paperwork and work note are in an envelope at the front desk ready for him to pick up.

## 2020-05-23 NOTE — Telephone Encounter (Signed)
No answer, no voicemail. (3rd attempt) Unable to contact patient. Letter mailed to patient at the address listed on file.

## 2020-05-29 ENCOUNTER — Encounter: Payer: Self-pay | Admitting: Medical-Surgical

## 2020-05-29 DIAGNOSIS — F4310 Post-traumatic stress disorder, unspecified: Secondary | ICD-10-CM

## 2020-05-29 DIAGNOSIS — F321 Major depressive disorder, single episode, moderate: Secondary | ICD-10-CM

## 2020-05-29 DIAGNOSIS — F411 Generalized anxiety disorder: Secondary | ICD-10-CM

## 2020-06-12 ENCOUNTER — Ambulatory Visit (INDEPENDENT_AMBULATORY_CARE_PROVIDER_SITE_OTHER): Payer: Managed Care, Other (non HMO) | Admitting: Diagnostic Neuroimaging

## 2020-06-12 ENCOUNTER — Encounter: Payer: Self-pay | Admitting: Diagnostic Neuroimaging

## 2020-06-12 VITALS — BP 122/70 | HR 83 | Ht 69.0 in | Wt 219.6 lb

## 2020-06-12 DIAGNOSIS — M79604 Pain in right leg: Secondary | ICD-10-CM | POA: Diagnosis not present

## 2020-06-12 NOTE — Progress Notes (Signed)
[facility]  PATIENT: Jeff Rogers DOB: Jun 07, 1991  REFERRING CLINICIAN: Christen Butter, NP HISTORY FROM: patient  REASON FOR VISIT: new consult    HISTORICAL  CHIEF COMPLAINT:  Chief Complaint  Patient presents with  . Numbness, Tingling of right leg    Rm 7 New Pt "symptoms x 6 years when I was doing my job- lifting, carrying improperly; numbness/tingling/sharp pain that has progressed to entire right leg; xray of back 4-5 months ago was normal"      HISTORY OF PRESENT ILLNESS:   30 year old male here for evaluation of right leg pain.  Symptoms started about 6 years ago he describes pain radiating from his right hip to his right lateral knee and sent to his right lateral ankle.  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  Patient continues to have intermittent pain and discomfort on the right side.  Patient referred here for evaluation of possible underlying neuropathy versus lumbar radiculopathy.   REVIEW OF SYSTEMS: Full 14 system review of systems performed and negative with exception of: As per HPI.  ALLERGIES: No Known Allergies  HOME MEDICATIONS: Outpatient Medications Prior to Visit  Medication Sig Dispense Refill  . busPIRone (BUSPAR) 10 MG tablet Take by mouth.    . escitalopram (LEXAPRO) 20 MG tablet Take 2 tablets (40 mg total) by mouth daily. 60 tablet 2  . hydrOXYzine (ATARAX/VISTARIL) 50 MG tablet Take 2 tablets (100 mg total) by mouth 3 (three) times daily as needed for anxiety. 180 tablet 3  . lurasidone (LATUDA) 40 MG TABS tablet Take 40 mg by mouth daily with breakfast.     No facility-administered medications prior to visit.    PAST MEDICAL HISTORY: Past Medical History:  Diagnosis Date  . GAD (generalized anxiety disorder) 02/23/2017  . Insomnia 09/13/2015  . MDD (major depressive disorder) 02/23/2017    PAST SURGICAL HISTORY: No past surgical history on file.  FAMILY  HISTORY: Family History  Problem Relation Age of Onset  . Healthy Mother   . Depression Father     SOCIAL HISTORY: Social History   Socioeconomic History  . Marital status: Single    Spouse name: Not on file  . Number of children: 0  . Years of education: Not on file  . Highest education level: Some college, no degree  Occupational History  . Not on file  Tobacco Use  . Smoking status: Former Smoker    Types: Cigarettes    Quit date: 10/07/2019    Years since quitting: 0.6  . Smokeless tobacco: Never Used  Substance and Sexual Activity  . Alcohol use: No  . Drug use: Yes    Types: Marijuana    Comment: twice a month  . Sexual activity: Not on file  Other Topics Concern  . Not on file  Social History Narrative   In college   No caffeine   Social Determinants of Health   Financial Resource Strain: Not on file  Food Insecurity: Not on file  Transportation Needs: Not on file  Physical Activity: Not on file  Stress: Not on file  Social Connections: Not on file  Intimate Partner Violence: Not on file     PHYSICAL EXAM  GENERAL EXAM/CONSTITUTIONAL: Vitals:  Vitals:   06/12/20 1104  BP: 122/70  Pulse: 83  Weight: 219 lb 9.6 oz (99.6 kg)  Height: 5\' 9"  (1.753 m)     Body mass index is 32.43 kg/m. Wt Readings  from Last 3 Encounters:  06/12/20 219 lb 9.6 oz (99.6 kg)  05/17/20 223 lb 9.6 oz (101.4 kg)  04/10/20 218 lb 12.8 oz (99.2 kg)     Patient is in no distress; well developed, nourished and groomed; neck is supple  CARDIOVASCULAR:  Examination of carotid arteries is normal; no carotid bruits  Regular rate and rhythm, no murmurs  Examination of peripheral vascular system by observation and palpation is normal  EYES:  Ophthalmoscopic exam of optic discs and posterior segments is normal; no papilledema or hemorrhages  No exam data present  MUSCULOSKELETAL:  Gait, strength, tone, movements noted in Neurologic exam  below  NEUROLOGIC: MENTAL STATUS:  No flowsheet data found.  awake, alert, oriented to person, place and time  recent and remote memory intact  normal attention and concentration  language fluent, comprehension intact, naming intact  fund of knowledge appropriate  CRANIAL NERVE:   2nd - no papilledema on fundoscopic exam  2nd, 3rd, 4th, 6th - pupils equal and reactive to light, visual fields full to confrontation, extraocular muscles intact, no nystagmus  5th - facial sensation symmetric  7th - facial strength symmetric  8th - hearing intact  9th - palate elevates symmetrically, uvula midline  11th - shoulder shrug symmetric  12th - tongue protrusion midline  MOTOR:   normal bulk and tone, full strength in the BUE, BLE  SENSORY:   normal and symmetric to light touch, temperature, vibration; DECR IN RIGHT KNEE  COORDINATION:   finger-nose-finger, fine finger movements normal  REFLEXES:   deep tendon reflexes TRACE and symmetric  GAIT/STATION:   narrow based gait     DIAGNOSTIC DATA (LABS, IMAGING, TESTING) - I reviewed patient records, labs, notes, testing and imaging myself where available.  Lab Results  Component Value Date   WBC 8.5 12/11/2017   HGB 13.8 12/11/2017   HCT 40.1 12/11/2017   MCV 87.4 12/11/2017   PLT 289 12/11/2017      Component Value Date/Time   NA 140 12/11/2017 2002   K 3.4 (L) 12/11/2017 2002   CL 107 12/11/2017 2002   CO2 26 12/11/2017 2002   GLUCOSE 97 12/11/2017 2002   BUN 11 12/11/2017 2002   CREATININE 0.74 12/11/2017 2002   CALCIUM 9.1 12/11/2017 2002   PROT 7.0 12/11/2017 2002   ALBUMIN 4.4 12/11/2017 2002   AST 18 12/11/2017 2002   ALT 16 12/11/2017 2002   ALKPHOS 61 12/11/2017 2002   BILITOT 0.9 12/11/2017 2002   GFRNONAA >60 12/11/2017 2002   GFRAA >60 12/11/2017 2002   No results found for: CHOL, HDL, LDLCALC, LDLDIRECT, TRIG, CHOLHDL No results found for: VHQI6N No results found for:  VITAMINB12 Lab Results  Component Value Date   TSH 0.770 12/13/2017    02/05/20 XRAY LUMBAR SPINE [I reviewed images myself and agree with interpretation. Overall looks ok.  -VRP]   - No lumbar compression deformity. - Subtle lumbar levocurvature. - Mild L5-S1 spondylosis as described.    ASSESSMENT AND PLAN  30 y.o. year old male here with:  Dx:  1. Right leg pain      PLAN:  RIGHT LEG PAIN / NUMBNESS (SINCE 2016; ? IT band syndrome vs neuropathy / radiculopathy) - check EMG/NCS  Orders Placed This Encounter  Procedures  . NCV with EMG(electromyography)   Return for for NCV/EMG.    Suanne Marker, MD 06/12/2020, 11:30 AM Certified in Neurology, Neurophysiology and Neuroimaging  [facility] [address] [place], [place]  [address] [phone]  

## 2020-06-21 ENCOUNTER — Ambulatory Visit: Payer: Self-pay | Admitting: Medical-Surgical

## 2020-06-27 ENCOUNTER — Encounter: Payer: Managed Care, Other (non HMO) | Admitting: Diagnostic Neuroimaging

## 2020-07-04 ENCOUNTER — Encounter: Payer: Self-pay | Admitting: Diagnostic Neuroimaging

## 2020-07-04 ENCOUNTER — Encounter (INDEPENDENT_AMBULATORY_CARE_PROVIDER_SITE_OTHER): Payer: Managed Care, Other (non HMO) | Admitting: Diagnostic Neuroimaging

## 2020-07-04 ENCOUNTER — Ambulatory Visit (INDEPENDENT_AMBULATORY_CARE_PROVIDER_SITE_OTHER): Payer: Managed Care, Other (non HMO) | Admitting: Diagnostic Neuroimaging

## 2020-07-04 DIAGNOSIS — M79604 Pain in right leg: Secondary | ICD-10-CM | POA: Diagnosis not present

## 2020-07-04 DIAGNOSIS — Z0289 Encounter for other administrative examinations: Secondary | ICD-10-CM

## 2020-07-04 NOTE — Procedures (Signed)
   [facility]  NCS (NERVE CONDUCTION STUDY) WITH EMG (ELECTROMYOGRAPHY) REPORT   STUDY DATE: 07/04/20 PATIENT NAME: Jeff Rogers DOB: 1990/09/30 MRN: [id number]  ORDERING CLINICIAN: Joycelyn Schmid, MD   TECHNOLOGIST: Durenda Age ELECTROMYOGRAPHER: Glenford Bayley. Reyes Fifield, MD  CLINICAL INFORMATION: 30 year old male with right leg pain.  FINDINGS: NERVE CONDUCTION STUDY:  Right peroneal and right tibial motor responses are normal.  Right sural and right superficial peroneal sensory responses are normal.  Right tibial F-wave latency is normal.   NEEDLE ELECTROMYOGRAPHY:  Needle examination of right lower extremity is normal.   IMPRESSION:   Normal study.  No electrodiagnostic evidence of large fiber neuropathy or lumbar radiculopathy at this time.    INTERPRETING PHYSICIAN:  Suanne Marker, MD Certified in Neurology, Neurophysiology and Neuroimaging  [facility] [address] [place], [place] [address] [phone]   [facility]    Nerve / Sites Muscle Latency Ref. Amplitude Ref. Rel Amp Segments Distance Velocity Ref. Area    ms ms mV mV %  cm m/s m/s mVms  R Peroneal - EDB     Ankle EDB 4.4 ?6.5 9.5 ?2.0 100 Ankle - EDB 9   30.8     Fib head EDB 10.0  8.8  92.9 Fib head - Ankle 29 52 ?44 28.9     Pop fossa EDB 12.0  8.5  96.4 Pop fossa - Fib head 10 50 ?44 28.4         Pop fossa - Ankle      R Tibial - AH     Ankle AH 3.5 ?5.8 11.3 ?4.0 100 Ankle - AH 9   23.5     Pop fossa AH 12.0  8.9  78.6 Pop fossa - Ankle 39 46 ?41 20.2         SNC    Nerve / Sites Rec. Site Peak Lat Ref.  Amp Ref. Segments Distance    ms ms V V  cm  R Sural - Ankle (Calf)     Calf Ankle 2.9 ?4.4 10 ?6 Calf - Ankle 14  R Superficial peroneal - Ankle     Lat leg Ankle 3.3 ?4.4 8 ?6 Lat leg - Ankle 14         F  Wave    Nerve F Lat Ref.   ms ms  R Tibial - AH 46.4 ?56.0       EMG Summary Table    Spontaneous MUAP Recruitment  Muscle  IA Fib PSW Fasc Other Amp Dur. Poly Pattern  R. Vastus medialis Normal None None None _______ Normal Normal Normal Normal  R. Tibialis anterior Normal None None None _______ Normal Normal Normal Normal  R. Gastrocnemius (Medial head) Normal None None None _______ Normal Normal Normal Normal  R. Biceps femoris (short head) Normal None None None _______ Normal Normal Normal Normal  R. Gluteus medius Normal None None None _______ Normal Normal Normal Normal  R. Lumbar paraspinals Normal None None None _______ Normal Normal Normal Normal

## 2020-07-10 ENCOUNTER — Encounter: Payer: Self-pay | Admitting: Medical-Surgical

## 2020-07-10 ENCOUNTER — Other Ambulatory Visit: Payer: Self-pay

## 2020-07-10 ENCOUNTER — Ambulatory Visit (INDEPENDENT_AMBULATORY_CARE_PROVIDER_SITE_OTHER): Payer: Managed Care, Other (non HMO) | Admitting: Medical-Surgical

## 2020-07-10 VITALS — BP 125/77 | HR 73 | Temp 97.8°F | Ht 69.0 in | Wt 225.3 lb

## 2020-07-10 DIAGNOSIS — F4310 Post-traumatic stress disorder, unspecified: Secondary | ICD-10-CM | POA: Diagnosis not present

## 2020-07-10 DIAGNOSIS — F321 Major depressive disorder, single episode, moderate: Secondary | ICD-10-CM | POA: Diagnosis not present

## 2020-07-10 DIAGNOSIS — R200 Anesthesia of skin: Secondary | ICD-10-CM

## 2020-07-10 DIAGNOSIS — F411 Generalized anxiety disorder: Secondary | ICD-10-CM

## 2020-07-10 DIAGNOSIS — R41840 Attention and concentration deficit: Secondary | ICD-10-CM

## 2020-07-10 DIAGNOSIS — R202 Paresthesia of skin: Secondary | ICD-10-CM

## 2020-07-10 MED ORDER — ESCITALOPRAM OXALATE 20 MG PO TABS: 40.0000 mg | ORAL_TABLET | Freq: Every day | ORAL | 1 refills | Status: DC

## 2020-07-10 MED ORDER — LURASIDONE HCL 40 MG PO TABS: 40.0000 mg | ORAL_TABLET | Freq: Every day | ORAL | 1 refills | Status: AC

## 2020-07-10 NOTE — Progress Notes (Signed)
Subjective:    CC: mood follow up  HPI: Pleasant 30 year old male presenting today for mood follow up. Currently taking Latuda, BuSpar, and Lexapro with prn Hydroxyzine. Notes that there are times he only takes 20mg  of Lexapro instead of the prescribed 40mg  daily. Feels that there is no increased benefit at the increased dose sometimes. Has recently limited his use of Hydroxyzine and doing okay with that. Taking the BuSpar and Latuda as prescribed. Was referred to psychiatry but the referral was denied with a comment about not taking worker's compensation. He has not been able to establish with psychiatry so far and would like a new referral placed. Feels his mood is overall good and he reports he is "doing better than I deserve". Denies SI/HI.   Underwent testing for his right leg pain/weakness/numbness/tingling with a normal nerve conduction study. Is interested in physical therapy to help regain the strength in the right leg.   Wonders if he may have ADHD since he has trouble with inattention. Remembers having some psychological testing several years ago but was diagnosed as bipolar and no mention of ADHD. Took one of his girlfriend's ADHD pills and reports it worked very well. It helped him feel focused and energetic.   I reviewed the past medical history, family history, social history, surgical history, and allergies today and no changes were needed.  Please see the problem list section below in epic for further details.  Past Medical History: Past Medical History:  Diagnosis Date  . GAD (generalized anxiety disorder) 02/23/2017  . Insomnia 09/13/2015  . MDD (major depressive disorder) 02/23/2017   Past Surgical History: History reviewed. No pertinent surgical history. Social History: Social History   Socioeconomic History  . Marital status: Single    Spouse name: Not on file  . Number of children: 0  . Years of education: Not on file  . Highest education level: Some college, no  degree  Occupational History  . Not on file  Tobacco Use  . Smoking status: Former Smoker    Types: Cigarettes    Quit date: 10/07/2019    Years since quitting: 0.7  . Smokeless tobacco: Never Used  Substance and Sexual Activity  . Alcohol use: No  . Drug use: Yes    Types: Marijuana    Comment: twice a month  . Sexual activity: Not on file  Other Topics Concern  . Not on file  Social History Narrative   In college   No caffeine   Social Determinants of Health   Financial Resource Strain: Not on file  Food Insecurity: Not on file  Transportation Needs: Not on file  Physical Activity: Not on file  Stress: Not on file  Social Connections: Not on file   Family History: Family History  Problem Relation Age of Onset  . Healthy Mother   . Depression Father    Allergies: No Known Allergies Medications: See med rec.  Review of Systems: See HPI for pertinent positives and negatives.   Objective:    General: Well Developed, well nourished, and in no acute distress.  Neuro: Alert and oriented x3.  HEENT: Normocephalic, atraumatic.  Skin: Warm and dry. Cardiac: Regular rate and rhythm, no murmurs rubs or gallops, no lower extremity edema.  Respiratory: Clear to auscultation bilaterally. Not using accessory muscles, speaking in full sentences.  Impression and Recommendations:    1. Current moderate episode of major depressive disorder without prior episode (HCC)/GAD/PTSD Continue prescribed medications. Refills provided. Referral to psychiatry re-entered and referral  coordinator notified of the previous issue.  - Ambulatory referral to Psychiatry  2.  Numbness and tingling of right leg Referring to PT.  - Ambulatory referral to Physical Therapy  5. Inattention Referring to psychology for ADHD testing.  - Ambulatory referral to Psychology  Return in about 3 months (around 10/07/2020) for mood follow up if not able to get in with psychiatry by  then. ___________________________________________ Thayer Ohm, DNP, APRN, FNP-BC Primary Care and Sports Medicine [facility]

## 2020-07-22 ENCOUNTER — Encounter: Payer: Self-pay | Admitting: Medical-Surgical

## 2020-07-30 ENCOUNTER — Ambulatory Visit: Payer: Managed Care, Other (non HMO) | Attending: Medical-Surgical | Admitting: Physical Therapy

## 2020-08-13 ENCOUNTER — Ambulatory Visit: Payer: Managed Care, Other (non HMO) | Attending: Medical-Surgical | Admitting: Physical Therapy

## 2020-08-13 ENCOUNTER — Other Ambulatory Visit: Payer: Self-pay

## 2020-08-13 ENCOUNTER — Encounter: Payer: Self-pay | Admitting: Physical Therapy

## 2020-08-13 DIAGNOSIS — M5416 Radiculopathy, lumbar region: Secondary | ICD-10-CM | POA: Diagnosis present

## 2020-08-13 DIAGNOSIS — R29898 Other symptoms and signs involving the musculoskeletal system: Secondary | ICD-10-CM | POA: Diagnosis present

## 2020-08-13 DIAGNOSIS — R29818 Other symptoms and signs involving the nervous system: Secondary | ICD-10-CM | POA: Insufficient documentation

## 2020-08-13 DIAGNOSIS — M79604 Pain in right leg: Secondary | ICD-10-CM | POA: Diagnosis not present

## 2020-08-13 DIAGNOSIS — M6281 Muscle weakness (generalized): Secondary | ICD-10-CM | POA: Insufficient documentation

## 2020-08-13 NOTE — Patient Instructions (Addendum)
     Access Code: [email or web address] URL: [email or web address] Date: 08/13/2020 Prepared by: Glenetta Hew  Exercises Supine ITB Stretch with Strap - 2-3 x daily - 7 x weekly - 3 reps - 30 sec hold Hooklying Hamstring Stretch with Strap - 2-3 x daily - 7 x weekly - 3 reps - 30 sec hold Supine Piriformis Stretch with Foot on Ground - 2-3 x daily - 7 x weekly - 3 reps - 30 sec hold

## 2020-08-13 NOTE — Therapy (Signed)
[facility] [address]  [address] [place], [place], [address] Phone: [phone]   Fax:  [phone]  Physical Therapy Evaluation  Patient Details  Name: Jeff Rogers MRN: [id number] Date of Birth: [birth year] Referring Provider (PT): Christen Butter, NP   Encounter Date: 08/13/2020   PT End of Session - 08/13/20 1450    Visit Number 1    Number of Visits --    Date for PT Re-Evaluation 09/24/20    Authorization Type [employer]    PT Start Time 1450    PT Stop Time 1537    PT Time Calculation (min) 47 min    Activity Tolerance Patient tolerated treatment well    Behavior During Therapy [facility] for tasks assessed/performed           Past Medical History:  Diagnosis Date  . GAD (generalized anxiety disorder) 02/23/2017  . Insomnia 09/13/2015  . MDD (major depressive disorder) 02/23/2017    History reviewed. No pertinent surgical history.  There were no vitals filed for this visit.    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 Pain has come back but more intense. NCV/EMG done which were negative. Feels that his body mechanics at work may be contributing to his pain as pain has reoccured as he has gone back to the job where he had been previoulsy having pain.    Limitations Standing;Walking    How long can you stand comfortably? 6 hrs    How long can you walk comfortably? 6 hrs    Diagnostic tests 07/04/20 - NCV/EMG:  Normal study.  No electrodiagnostic evidence of large fiber neuropathy or lumbar radiculopathy at this time.    Patient Stated Goals "to keep my pain under control"    Currently in Pain? Yes    Pain Score 8    up to 9/10 when working   Pain Location Knee    Pain Orientation Right;Lateral    Pain Descriptors / Indicators Sharp;Discomfort;Tightness   "nevry"   Pain Type Acute pain;Chronic pain     Pain Radiating Towards tingling (feeling like releasing tension) typcially at teh end of the day after working    Pain Onset More than a month ago   Dec 2020   Pain Frequency Intermittent    Aggravating Factors  prolonged standing or walking, being on his feet & lifting/moving things at work    Pain Relieving Factors nothing, "ice used to work", Loss adjuster, chartered balm, stretches    Effect of Pain on Daily Activities limits work tolerance - has to change how he moves or slow down after ~6 hrs; pain prevents him from playing soccer              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  No    Is the patient reluctant to leave their home because of a fear of falling?  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 based on productivity 5 days/wk - standing/walking on concrete; lifting to 30-60# ~ 300 times/hour placing item on a lift ~2-3 ft twisting body    Leisure music; occasionally play soccer      Cognition   Overall Cognitive Status Within Functional Limits for tasks assessed      ROM / Strength   AROM / PROM / Strength AROM;Strength      AROM   AROM Assessment Site Lumbar    Lumbar Flexion WFL    Lumbar Extension WFL     Lumbar - Right Side Bend 80% - pain in R hip/lateral thigh    Lumbar - Left Side Bend 80% - pulling on R    Lumbar - Right Rotation 90% - discomfort in R hiplateral thigh    Lumbar - Left Rotation 90% - no symptoms      Strength   Strength Assessment Site Hip;Knee;Ankle    Right/Left Hip Right;Left    Right Hip Flexion 4-/5    Right Hip Extension 3+/5    Right Hip External Rotation  4-/5    Right Hip Internal Rotation 3+/5    Right Hip ABduction 4-/5    Right Hip ADduction 4/5    Left Hip Flexion 4+/5    Left Hip Extension 4/5    Left Hip External Rotation 4+/5    Left Hip Internal Rotation 4/5    Left Hip ABduction 3+/5    Left Hip ADduction 4/5    Right/Left Knee Right;Left    Right Knee Flexion 4+/5    Right Knee Extension 4+/5    Left Knee Flexion 5/5    Left Knee Extension 5/5    Right/Left Ankle Right;Left    Right Ankle Dorsiflexion 4/5    Right Ankle Plantar Flexion 4/5   13 SLS heel raises   Left Ankle Dorsiflexion 4+/5    Left Ankle Plantar Flexion 4+/5   15 SLS heel raises     Flexibility   Soft Tissue Assessment /Muscle Length yes    Hamstrings mod tight R>L    Quadriceps mild tight R>L    ITB mod tight R>L    Piriformis mild/mod tight R>L      Palpation   Palpation comment tight R QL, lats, glut med/max, piriformis, lateral hamstrings, ITB with mild tttp in lumbar paraspinals & R glutes                      Objective measurements completed on examination: See above findings.               PT Education - 08/13/20 1537    Education provided Yes    Education Details PT eval findings, anticipated POC & initial HEP - Access Code: [email or web address]    Person(s) Educated Patient    Methods Explanation;Demonstration;Verbal cues;Tactile cues;Handout    Comprehension Verbalized understanding;Verbal cues required;Tactile cues required;Returned demonstration;Need further instruction  PT Long Term Goals - 08/13/20 1834      PT  LONG TERM GOAL #1   Title Improve understanding of body mechanics and spine care with pt to verbalize and demo correct lifting techniques    Status New    Target Date 09/24/20      PT LONG TERM GOAL #2   Title Patient to demonstrate improved tissue quality and pliability as noted by reduced tissue tightness and tenderness to palpation    Status New    Target Date 09/24/20      PT LONG TERM GOAL #3   Title Decrease R LE pain by >/= 50-75% allowing patient increased ease of completion of full work shift w/o limitation due to pain    Status New    Target Date 09/24/20      PT LONG TERM GOAL #4   Title Patient will demonstrate improved R strength to >/= 4+/5 for improved stability and ease of mobility    Status New    Target Date 09/24/20      PT LONG TERM GOAL #5   Title Patient will be independent with ongoing/advanced HEP +/- gym program for self-management at home    Status New    Target Date 09/24/20                  Plan - 08/13/20 1537    Clinical Impression Statement Mardelle Mattendy is a 30 y/o male who presents to OP PT for R LE weakness and tingling which he attributes to ITB syndrome, having reportedly previously been treated by PT in ~2014/2015 for similar problem. Prior PT episode noted at another [facility] clinic in early 2017 for LBP and R LE radicular pain. He notes good relief of pain at the time but feels that current pain is likely and exacerbation of his prior problems as it seemed to coincide with his return to work in a job that requires repetitive forward flexed and twisted posture throughout his workday which he attributed as the cause of the prior pain. He admits to poor awareness of posture and body mechanics, "just wanting to get through the day as quick as possible" as his work time is quota based. Current deficits include R lateral knee pain consistent with ITB syndrome but also notes signs and symptoms consistent with lumbar radiculopathy including intermittent  tingling sensation at knee and lower leg although recent NCV/EMG negative for evidence of large fiber neuropathy or lumbar radiculopathy. Other deficits include increased muscle tension, very mildly limited lumbar AROM, limited proximal LE flexibility and R>L LE weakness. Mardelle Mattendy will benefit from skilled PT to address above deficits to reduce myofascial pain and tightness, improve LE flexibility and improve core strength to restore normal mobility and allow for increased participation in desired activities. In addition, he will benefit from training in proper posture and body mechanics to protect lumbar spine and reduce risk of injury at work.    Personal Factors and Comorbidities Time since onset of injury/illness/exacerbation;Past/Current Experience;Comorbidity 3+;Profession    Comorbidities R GT bursitis, depression, anxiety, PTSD, moderate cannabis use    Examination-Activity Limitations Stand;Locomotion Level    Examination-Participation Restrictions Occupation;Community Activity    Stability/Clinical Decision Making Stable/Uncomplicated    Clinical Decision Making Low    Rehab Potential Good    PT Frequency 1x / week   1-2x/wk - 1x/wk initially due to high OOP cost   PT Duration 6 weeks    PT Treatment/Interventions ADLs/Self Care Home Management;Cryotherapy;Electrical Stimulation;Iontophoresis 4mg /ml Dexamethasone;Moist  Heat;Ultrasound;Gait training;Functional mobility training;Therapeutic activities;Therapeutic exercise;Neuromuscular re-education;Patient/family education;Manual techniques;Passive range of motion;Dry needling;Taping    PT Next Visit Plan Review initial HEP and add lumbar radiculopathy focused exercise; manual therapy including DN as indicated; posture & body mechanics education with work simulation PRN    PT Home Exercise Plan MedBridge Access Code: [email or web address] (3/8)    Consulted and Agree with Plan of Care Patient           Patient will benefit from skilled therapeutic  intervention in order to improve the following deficits and impairments:  Decreased activity tolerance,Decreased endurance,Decreased knowledge of precautions,Decreased range of motion,Decreased strength,Difficulty walking,Increased fascial restricitons,Increased muscle spasms,Impaired perceived functional ability,Impaired flexibility,Improper body mechanics,Postural dysfunction,Pain  Visit Diagnosis: Pain in right leg  Other symptoms and signs involving the musculoskeletal system  Radiculopathy, lumbar region  Muscle weakness (generalized)     Problem List Patient Active Problem List   Diagnosis Date Noted  . Other symptoms and signs involving the nervous system 08/13/2020  . Numbness and tingling of right leg 07/12/2019  . Greater trochanteric bursitis, right 07/03/2019  . Major depression, recurrent (HCC) 12/12/2017  . Moderate cannabis use disorder (HCC)   . Posttraumatic stress disorder   . Constipation 05/10/2017  . MDD (major depressive disorder) 02/23/2017  . Generalized anxiety disorder 02/23/2017  . Insomnia 09/13/2015    Marry Guan, PT, MPT 08/13/2020, 6:50 PM  [facility] [address]  [address] [place], [place], [address] Phone: [phone]   Fax:  [phone]  Name: Jeff Rogers MRN: [id number] Date of Birth: 1990-09-16

## 2020-08-21 ENCOUNTER — Ambulatory Visit: Payer: Managed Care, Other (non HMO) | Admitting: Physical Therapy

## 2020-08-26 ENCOUNTER — Ambulatory Visit: Payer: Managed Care, Other (non HMO)

## 2020-08-26 ENCOUNTER — Other Ambulatory Visit: Payer: Self-pay

## 2020-08-26 DIAGNOSIS — M79604 Pain in right leg: Secondary | ICD-10-CM

## 2020-08-26 DIAGNOSIS — M5416 Radiculopathy, lumbar region: Secondary | ICD-10-CM

## 2020-08-26 DIAGNOSIS — R29898 Other symptoms and signs involving the musculoskeletal system: Secondary | ICD-10-CM

## 2020-08-26 DIAGNOSIS — M6281 Muscle weakness (generalized): Secondary | ICD-10-CM

## 2020-08-26 NOTE — Therapy (Signed)
[facility] [address]  [address] [place], [place], [address] Phone: [phone]   Fax:  [phone]  Physical Therapy Treatment  Patient Details  Name: Jeff Rogers MRN: [id number] Date of Birth: 1991/04/22 Referring Provider (PT): Christen Butter, NP   Encounter Date: 08/26/2020   PT End of Session - 08/26/20 0848    Visit Number 2    Date for PT Re-Evaluation 09/24/20    Authorization Type [employer]    PT Start Time 0806    PT Stop Time 0845    PT Time Calculation (min) 39 min    Activity Tolerance Patient tolerated treatment well    Behavior During Therapy [facility] for tasks assessed/performed           Past Medical History:  Diagnosis Date  . GAD (generalized anxiety disorder) 02/23/2017  . Insomnia 09/13/2015  . MDD (major depressive disorder) 02/23/2017    History reviewed. No pertinent surgical history.  There were no vitals filed for this visit.   Subjective Assessment - 08/26/20 0808    Subjective Pt reports the exercises seem like they are helping but feels like he has been overdoing it.    Diagnostic tests 07/04/20 - NCV/EMG:  Normal study.  No electrodiagnostic evidence of large fiber neuropathy or lumbar radiculopathy at this time.    Patient Stated Goals "to keep my pain under control"    Currently in Pain? No/denies                             [facility] Adult PT Treatment/Exercise - 08/26/20 0001      Exercises   Exercises Knee/Hip;Lumbar      Lumbar Exercises: Supine   Pelvic Tilt 1 rep;10 reps    Clam 10 reps;3 seconds;Limitations    Clam Limitations Rtband    Bridge Compliant;10 reps;2 seconds;Limitations    Bridge Limitations R tband    Straight Leg Raise 10 reps;2 seconds    Straight Leg Raises Limitations weakness noted; cues for eccentric control to mat      Knee/Hip Exercises: Stretches   Passive Hamstring Stretch Right;Left;1 rep;30 seconds    Passive Hamstring Stretch  Limitations supine with strap    ITB Stretch Right;Left;1 rep;30 seconds    ITB Stretch Limitations supine with strap    Piriformis Stretch Right;Left;1 rep;30 seconds    Piriformis Stretch Limitations supine; figure 4      Knee/Hip Exercises: Aerobic   Nustep L2x58min                  PT Education - 08/26/20 [phone]    Education provided Yes    Education Details HEP update: Access Code: [email or web address]; Educated on keeping proper alignment of the spine using pillows under knees in supine/prone positions to reduce stress on spine.    Person(s) Educated Patient    Methods Explanation;Demonstration;Tactile cues;Verbal cues;Handout    Comprehension Verbalized understanding;Returned demonstration;Verbal cues required;Tactile cues required;Need further instruction               PT Long Term Goals - 08/26/20 0856      PT LONG TERM GOAL #1   Title Improve understanding of body mechanics and spine care with pt to verbalize and demo correct lifting techniques    Status On-going      PT LONG TERM GOAL #2   Title Patient to demonstrate improved tissue quality and pliability as noted by reduced tissue tightness and  tenderness to palpation    Status On-going      PT LONG TERM GOAL #3   Title Decrease R LE pain by >/= 50-75% allowing patient increased ease of completion of full work shift w/o limitation due to pain    Status On-going      PT LONG TERM GOAL #4   Title Patient will demonstrate improved R strength to >/= 4+/5 for improved stability and ease of mobility    Status On-going      PT LONG TERM GOAL #5   Title Patient will be independent with ongoing/advanced HEP +/- gym program for self-management at home    Status On-going                 Plan - 08/26/20 0849    Clinical Impression Statement Reviewed HEP with pt to ensure understanding, he reported that at home he felt like he was pulling too hard on the stretches, educated pt on lightly stretching the muscles to  feel a slight pull/discomfort, not to overstretch to cause pain. Also educated on positioning to help keep proper alignment of the spine, mostly laying down. Focused on lumbopelvic mobility today, facilitating proper spinal alignment and positioning to stability the spine and pelvis. He demonstrated good performance of exercises, cues were needed for eccentric control with straight leg raises as he showed a good amount of weakness with these. Pt responded well overall.    Personal Factors and Comorbidities Time since onset of injury/illness/exacerbation;Past/Current Experience;Comorbidity 3+;Profession    Comorbidities R GT bursitis, depression, anxiety, PTSD, moderate cannabis use    PT Frequency 1x / week    PT Duration 6 weeks    PT Treatment/Interventions ADLs/Self Care Home Management;Cryotherapy;Electrical Stimulation;Iontophoresis 4mg /ml Dexamethasone;Moist Heat;Ultrasound;Gait training;Functional mobility training;Therapeutic activities;Therapeutic exercise;Neuromuscular re-education;Patient/family education;Manual techniques;Passive range of motion;Dry needling;Taping    PT Next Visit Plan add lumbar radiculopathy focused exercise; manual therapy including DN as indicated; posture & body mechanics education with work simulation PRN    PT Home Exercise Plan MedBridge Access Code: (3/8), update (3/21) Access Code: [email or web address]    Consulted and Agree with Plan of Care Patient           Patient will benefit from skilled therapeutic intervention in order to improve the following deficits and impairments:  Decreased activity tolerance,Decreased endurance,Decreased knowledge of precautions,Decreased range of motion,Decreased strength,Difficulty walking,Increased fascial restricitons,Increased muscle spasms,Impaired perceived functional ability,Impaired flexibility,Improper body mechanics,Postural dysfunction,Pain  Visit Diagnosis: Pain in right leg  Other symptoms and signs involving the  musculoskeletal system  Radiculopathy, lumbar region  Muscle weakness (generalized)     Problem List Patient Active Problem List   Diagnosis Date Noted  . Other symptoms and signs involving the nervous system 08/13/2020  . Numbness and tingling of right leg 07/12/2019  . Greater trochanteric bursitis, right 07/03/2019  . Major depression, recurrent (HCC) 12/12/2017  . Moderate cannabis use disorder (HCC)   . Posttraumatic stress disorder   . Constipation 05/10/2017  . MDD (major depressive disorder) 02/23/2017  . Generalized anxiety disorder 02/23/2017  . Insomnia 09/13/2015    11/13/2015, PTA 08/26/2020, 9:00 AM  [facility] [address]  [address] [place], [place], [place] Phone: [phone]   Fax:  [phone]  Name: Shoji Pertuit MRN: Ruby Cola Date of Birth: 11-13-1990

## 2020-08-26 NOTE — Patient Instructions (Signed)
Access Code: [email or web address] URL: [email or web address] Date: 08/26/2020 Prepared by: Verta Ellen  Exercises Supine Pelvic Tilt - 1 x daily - 7 x weekly - 2 sets - 10 reps - 3 sec hold Supine Bridge - 1 x daily - 7 x weekly - 2 sets - 10 reps Hooklying Clamshell with Resistance - 1 x daily - 7 x weekly - 2 sets - 10 reps

## 2020-09-02 ENCOUNTER — Ambulatory Visit: Payer: Managed Care, Other (non HMO)

## 2020-09-09 ENCOUNTER — Encounter: Payer: Managed Care, Other (non HMO) | Admitting: Physical Therapy

## 2020-09-12 ENCOUNTER — Ambulatory Visit (INDEPENDENT_AMBULATORY_CARE_PROVIDER_SITE_OTHER): Payer: Managed Care, Other (non HMO) | Admitting: Medical-Surgical

## 2020-09-12 ENCOUNTER — Other Ambulatory Visit: Payer: Self-pay

## 2020-09-12 ENCOUNTER — Encounter: Payer: Self-pay | Admitting: Medical-Surgical

## 2020-09-12 VITALS — BP 119/80 | HR 100 | Temp 98.8°F | Ht 69.0 in | Wt 226.8 lb

## 2020-09-12 DIAGNOSIS — M25551 Pain in right hip: Secondary | ICD-10-CM

## 2020-09-12 MED ORDER — PREDNISONE 50 MG PO TABS: 50.0000 mg | ORAL_TABLET | Freq: Every day | ORAL | 0 refills | Status: DC

## 2020-09-12 NOTE — Progress Notes (Signed)
Subjective:    CC: right hip pain  HPI: Pleasant 30 year old male presenting today to discuss right hip pain. Previous right lateral knee pain that seems to have moved up towards the thigh and is now the hip. Pain worse with sitting normally, better when sitting with his foot propped on his other knee. Still gets "bugs crawling" sensation along the lower right leg at night. Has been going to PT which is helping with the IT band issues but missed his last visit due to COVID exposure at work. Over the last couple of weeks, has noted some weight gain and wonders if this is making his hip hurt worse. Denies weakness of the lower extremities, excessive physical activity, and known injury.   Has been able to get in with psychiatry and is doing well on his medications. They are managing his behavioral health issues now. Did have testing and was diagnosed as ADHD. Was started on Adderall but the 10mg  was not working. Reports he was told to take it twice daily but was only given enough for 15 days instead of the month. Wondering who he should call to get the issue fixed.   I reviewed the past medical history, family history, social history, surgical history, and allergies today and no changes were needed.  Please see the problem list section below in epic for further details.  Past Medical History: Past Medical History:  Diagnosis Date  . GAD (generalized anxiety disorder) 02/23/2017  . Insomnia 09/13/2015  . MDD (major depressive disorder) 02/23/2017   Past Surgical History: History reviewed. No pertinent surgical history. Social History: Social History   Socioeconomic History  . Marital status: Single    Spouse name: Not on file  . Number of children: 0  . Years of education: Not on file  . Highest education level: Some college, no degree  Occupational History  . Not on file  Tobacco Use  . Smoking status: Former Smoker    Types: Cigarettes    Quit date: 10/07/2019    Years since quitting:  0.9  . Smokeless tobacco: Never Used  Substance and Sexual Activity  . Alcohol use: No  . Drug use: Yes    Types: Marijuana    Comment: twice a month  . Sexual activity: Not on file  Other Topics Concern  . Not on file  Social History Narrative   In college   No caffeine   Social Determinants of Health   Financial Resource Strain: Not on file  Food Insecurity: Not on file  Transportation Needs: Not on file  Physical Activity: Not on file  Stress: Not on file  Social Connections: Not on file   Family History: Family History  Problem Relation Age of Onset  . Healthy Mother   . Depression Father    Allergies: No Known Allergies Medications: See med rec.  Review of Systems: See HPI for pertinent positives and negatives.   Objective:    General: Well Developed, well nourished, and in no acute distress.  Neuro: Alert and oriented x3.  HEENT: Normocephalic, atraumatic.  Skin: Warm and dry. Cardiac: Regular rate and rhythm, no murmurs rubs or gallops, no lower extremity edema.  Respiratory: Clear to auscultation bilaterally. Not using accessory muscles, speaking in full sentences. Right hip: Gait and weight bearing normal. FABER and FADIR negative. Pain in the lateral aspect of the hip with full flexion and with sitting upright.  Impression and Recommendations:    1. 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 Prednisone 50mg  daily x 5 days. After finishing prednisone, okay to take Meloxicam 15mg  daily as needed for pain. Resume physical therapy. Can take Tylenol 1,000mg  every 8 hours as needed. Consider use of heat, ice, stretching, and massage.   Return if symptoms worsen or fail to improve.  ___________________________________________ , DNP, APRN, FNP-BC Primary Care and Sports Medicine [facility]

## 2020-09-16 ENCOUNTER — Ambulatory Visit: Payer: Managed Care, Other (non HMO) | Attending: Medical-Surgical

## 2020-09-16 ENCOUNTER — Encounter: Payer: Self-pay | Admitting: Medical-Surgical

## 2020-09-16 ENCOUNTER — Other Ambulatory Visit: Payer: Self-pay

## 2020-09-16 DIAGNOSIS — M79604 Pain in right leg: Secondary | ICD-10-CM | POA: Diagnosis present

## 2020-09-16 DIAGNOSIS — M6281 Muscle weakness (generalized): Secondary | ICD-10-CM | POA: Insufficient documentation

## 2020-09-16 DIAGNOSIS — R293 Abnormal posture: Secondary | ICD-10-CM | POA: Insufficient documentation

## 2020-09-16 DIAGNOSIS — R29898 Other symptoms and signs involving the musculoskeletal system: Secondary | ICD-10-CM | POA: Insufficient documentation

## 2020-09-16 DIAGNOSIS — M5416 Radiculopathy, lumbar region: Secondary | ICD-10-CM | POA: Diagnosis present

## 2020-09-16 NOTE — Therapy (Addendum)
[facility] [address]  [place], [place], [address] Phone: [phone]   Fax:  [phone]  Physical Therapy Treatment / Discharge Summary  Patient Details  Name: Jeff Rogers MRN: [id number] Date of Birth: [birth year] Referring Provider (PT): Samuel Bouche, NP   Encounter Date: 09/16/2020   PT End of Session - 09/16/20 0845    Visit Number 3    Date for PT Re-Evaluation 09/24/20    Authorization Type [employer]    PT Start Time 0810   pt late   PT Stop Time 0841    PT Time Calculation (min) 31 min    Activity Tolerance Patient tolerated treatment well    Behavior During Therapy [facility] for tasks assessed/performed           Past Medical History:  Diagnosis Date  . GAD (generalized anxiety disorder) 02/23/2017  . Insomnia 09/13/2015  . MDD (major depressive disorder) 02/23/2017    History reviewed. No pertinent surgical history.  There were no vitals filed for this visit.   Subjective Assessment - 09/16/20 0813    Subjective Pt has been out with COVID for past 2 weeks, feels better today. Reports pain in low back from doing exercises, but thinks he didn't do his exercises correctly.    Diagnostic tests 07/04/20 - NCV/EMG:  Normal study.  No electrodiagnostic evidence of large fiber neuropathy or lumbar radiculopathy at this time.    Patient Stated Goals "to keep my pain under control"    Currently in Pain? No/denies                             [facility] Adult PT Treatment/Exercise - 09/16/20 0001      Exercises   Exercises Knee/Hip;Lumbar      Lumbar Exercises: Aerobic   Nustep L3x51mn      Lumbar Exercises: Machines for Strengthening   Leg Press 25# 10 reps      Lumbar Exercises: Standing   Functional Squats 10 reps    Functional Squats Limitations B 5#      Lumbar Exercises: Seated   Sit to Stand 20 reps      Lumbar Exercises: Supine   Bent Knee Raise 10 reps;2 seconds    Bent Knee  Raise Limitations Rtband    Bridge Compliant;10 reps;5 seconds    Bridge Limitations Rtband      Lumbar Exercises: Sidelying   Clam Right;Left;10 reps;2 seconds    Clam Limitations Rtband      Knee/Hip Exercises: Standing   Hip Flexion Stengthening;Both;1 set;10 reps;Knee straight    Hip Flexion Limitations Rtband, 2 ski pole assist    Hip Abduction Stengthening;Both;1 set;10 reps;Knee straight    Abduction Limitations Rtband, 2 ski pole assist    Hip Extension Stengthening;Both;1 set;10 reps;Knee straight    Extension Limitations Rtband, 2 ski pole assist                  PT Education - 09/16/20 0839    Education provided Yes    Education Details HEP update; Access Code: [email or web address]   Person(s) Educated Patient    Methods Explanation;Demonstration;Verbal cues;Handout    Comprehension Verbalized understanding;Returned demonstration;Verbal cues required               PT Long Term Goals - 09/16/20 0850      PT LONG TERM GOAL #1   Title Improve understanding of body mechanics and spine  care with pt to verbalize and demo correct lifting techniques    Status Partially Met      PT LONG TERM GOAL #2   Title Patient to demonstrate improved tissue quality and pliability as noted by reduced tissue tightness and tenderness to palpation    Status On-going      PT LONG TERM GOAL #3   Title Decrease R LE pain by >/= 50-75% allowing patient increased ease of completion of full work shift w/o limitation due to pain    Status On-going      PT LONG TERM GOAL #4   Title Patient will demonstrate improved R strength to >/= 4+/5 for improved stability and ease of mobility    Status On-going      PT LONG TERM GOAL #5   Title Patient will be independent with ongoing/advanced HEP +/- gym program for self-management at home    Status On-going                 Plan - 09/16/20 0845    Clinical Impression Statement Pt has been out d/t COVID for past 2 weeks. He showed good  performance of the exercises with no complaints except increase muscle tension from using the muscles. Focused on good posture and body mechanics during exercises, pt noted that he has been focusing on keep good neutral alignment of his spine and propping his LEs up when laying and reported a big difference since focusing on this. Progressed him to more compound exercises in standing to incorporate more stabilizing muscles and increase tolerance to standing. He reports that the exercises are helping and he is "lossening" up his ITB.    Personal Factors and Comorbidities Time since onset of injury/illness/exacerbation;Past/Current Experience;Comorbidity 3+;Profession    Comorbidities R GT bursitis, depression, anxiety, PTSD, moderate cannabis use    PT Frequency 1x / week    PT Duration 6 weeks    PT Treatment/Interventions ADLs/Self Care Home Management;Cryotherapy;Electrical Stimulation;Iontophoresis 51m/ml Dexamethasone;Moist Heat;Ultrasound;Gait training;Functional mobility training;Therapeutic activities;Therapeutic exercise;Neuromuscular re-education;Patient/family education;Manual techniques;Passive range of motion;Dry needling;Taping    PT Next Visit Plan add lumbar radiculopathy focused exercise; manual therapy including DN as indicated; posture & body mechanics education with work simulation PRN    PT HMorriltonAccess Code: [email or web address](3/8), update (3/21) Access Code: [email or web address]   Consulted and Agree with Plan of Care Patient           Patient will benefit from skilled therapeutic intervention in order to improve the following deficits and impairments:  Decreased activity tolerance,Decreased endurance,Decreased knowledge of precautions,Decreased range of motion,Decreased strength,Difficulty walking,Increased fascial restricitons,Increased muscle spasms,Impaired perceived functional ability,Impaired flexibility,Improper body mechanics,Postural dysfunction,Pain  Visit  Diagnosis: Abnormal posture  Pain in right leg  Other symptoms and signs involving the musculoskeletal system  Radiculopathy, lumbar region  Muscle weakness (generalized)     Problem List Patient Active Problem List   Diagnosis Date Noted  . Other symptoms and signs involving the nervous system 08/13/2020  . Numbness and tingling of right leg 07/12/2019  . Greater trochanteric bursitis, right 07/03/2019  . Major depression, recurrent ([place] 12/12/2017  . Moderate cannabis use disorder (HCC)   . Posttraumatic stress disorder   . Constipation 05/10/2017  . MDD (major depressive disorder) 02/23/2017  . Generalized anxiety disorder 02/23/2017  . Insomnia 09/13/2015    BArtist Pais PTA 09/16/2020, 9:18 AM  [facility] [place] [address]Phone: [phone]  Fax:  [phone]  Name: Jeff Rogers MRN: [id number] Date of Birth: 01/02/1991  PHYSICAL THERAPY DISCHARGE SUMMARY  Visits from Start of Care: 3  Current functional level related to goals / functional outcomes:   Refer to above clinical impression for status as of last visit on 09/16/2020. Patient cancelled next appointment and was unable to return to PT for >30 days due to a change in his work schedule, therefore will proceed with discharge from PT for this episode.   Remaining deficits:   As above.  Unable to formally assess status at D/C due to inability to return to PT   Education / Equipment:   HEP  Plan: Patient agrees to discharge.  Patient goals were not met. Patient is being discharged due to not returning since the last visit.  ?????     Percival Spanish, PT, MPT 11/13/20, 9:22 AM  [facility] [address]  [place], [place], [address] Phone: [phone]   Fax:  [phone]

## 2020-09-23 ENCOUNTER — Ambulatory Visit: Payer: Managed Care, Other (non HMO) | Admitting: Physical Therapy

## 2020-10-07 ENCOUNTER — Ambulatory Visit: Payer: Managed Care, Other (non HMO) | Admitting: Medical-Surgical

## 2020-10-07 DIAGNOSIS — F411 Generalized anxiety disorder: Secondary | ICD-10-CM

## 2020-10-07 DIAGNOSIS — R41840 Attention and concentration deficit: Secondary | ICD-10-CM

## 2020-10-07 DIAGNOSIS — F4310 Post-traumatic stress disorder, unspecified: Secondary | ICD-10-CM

## 2020-10-07 DIAGNOSIS — F321 Major depressive disorder, single episode, moderate: Secondary | ICD-10-CM

## 2021-06-13 IMAGING — DX DG LUMBAR SPINE COMPLETE 4+V
5 series · 5 of 5 positions shown · non-contrast
Comparison: Radiographs of the lumbar spine 07/03/2015

CLINICAL DATA: Low back pain, tenderness on lower lumbar spine.
Right-sided muscle pain.

EXAM:
LUMBAR SPINE - COMPLETE 4+ VIEW

[l-spine ap]
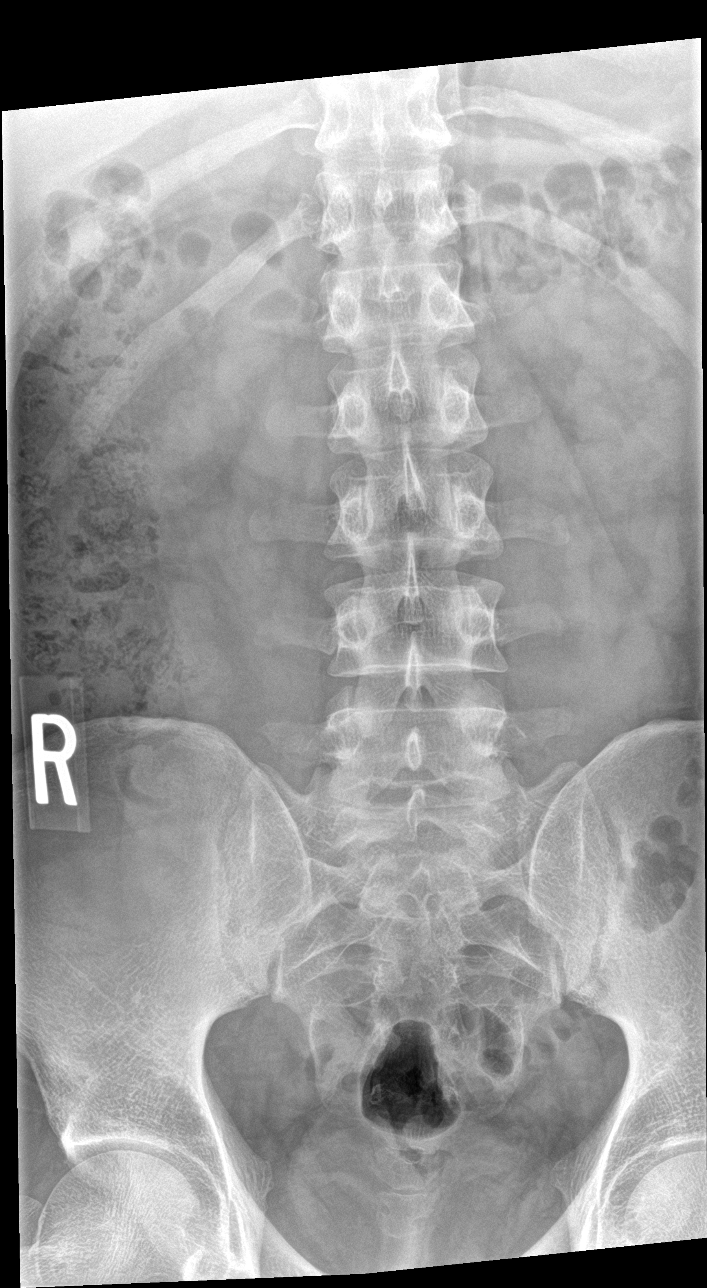

[l-spine obl (1 of 2)]
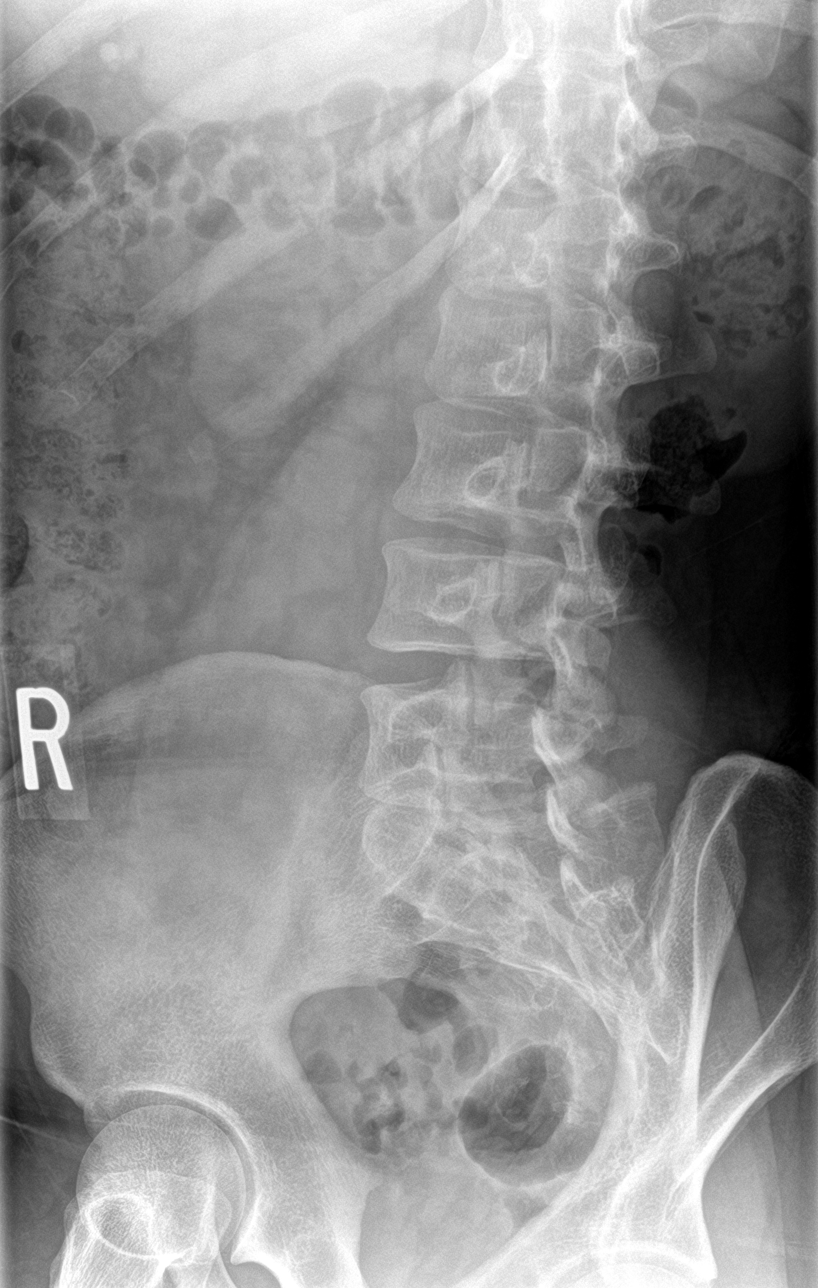

[l-spine obl (2 of 2)]
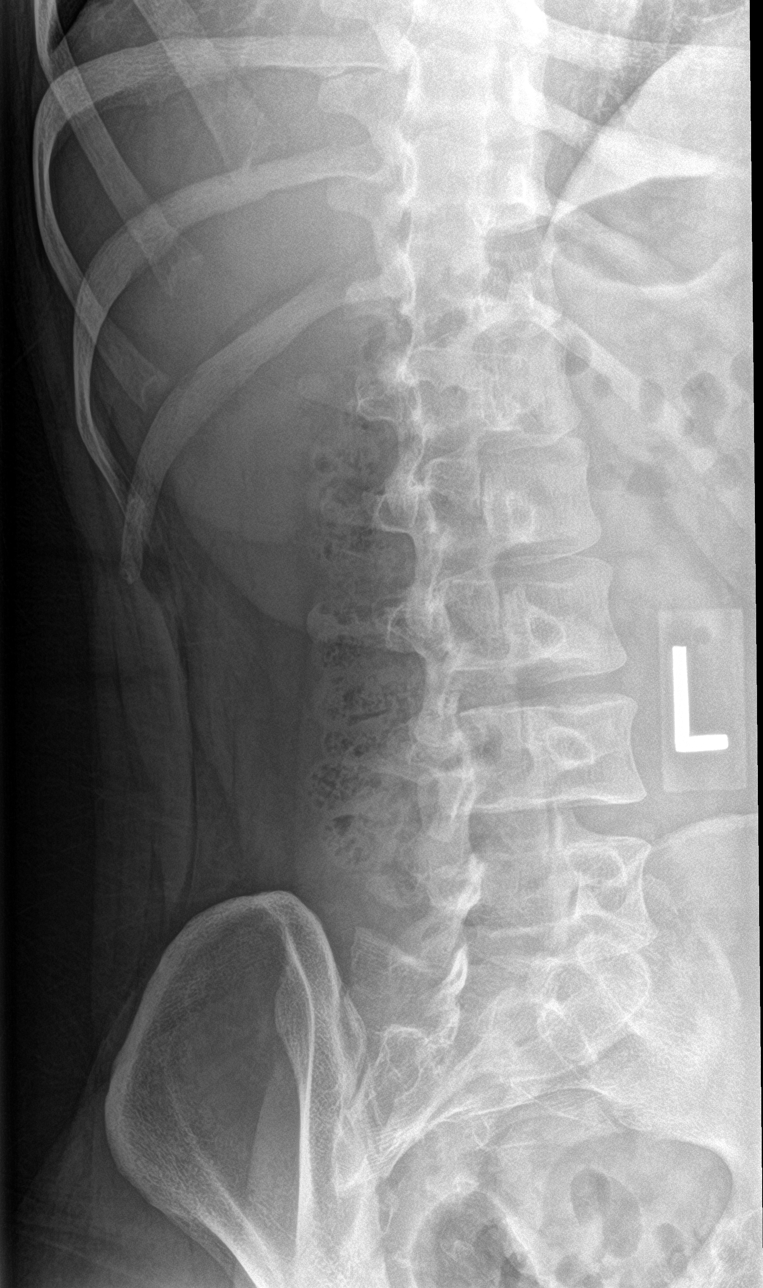

[l-spine lat]
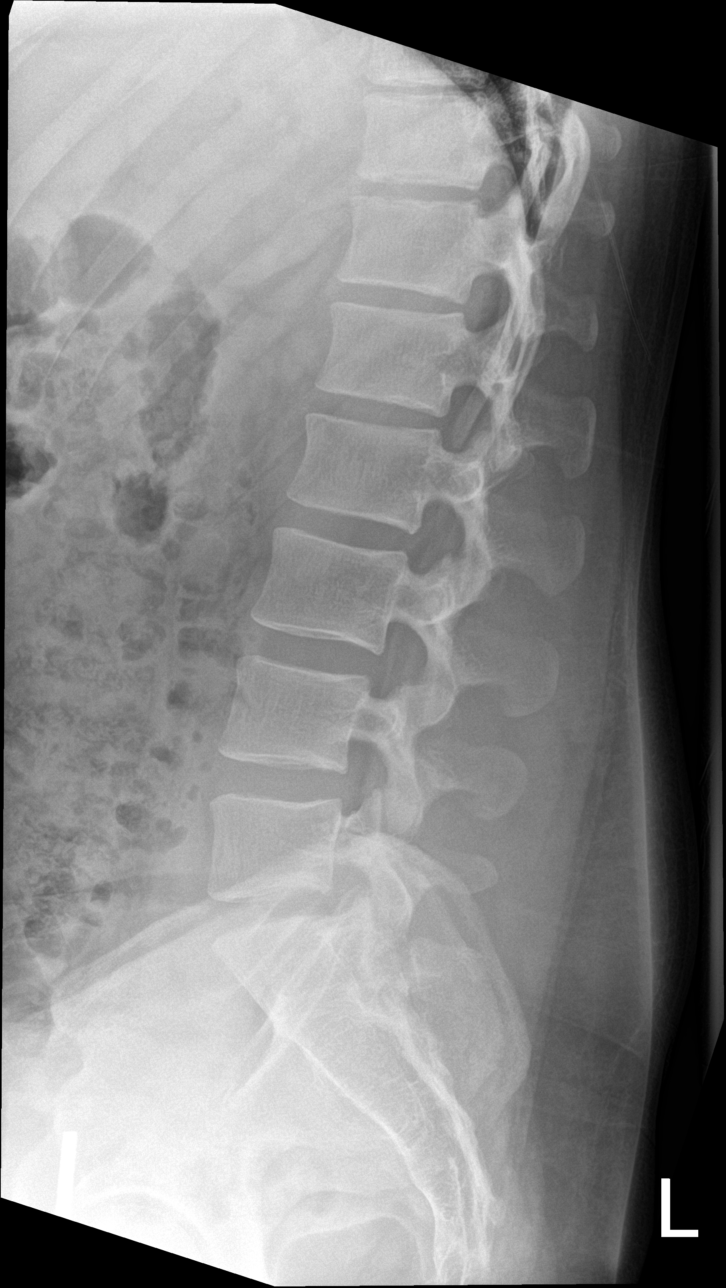

[l-spine spot]
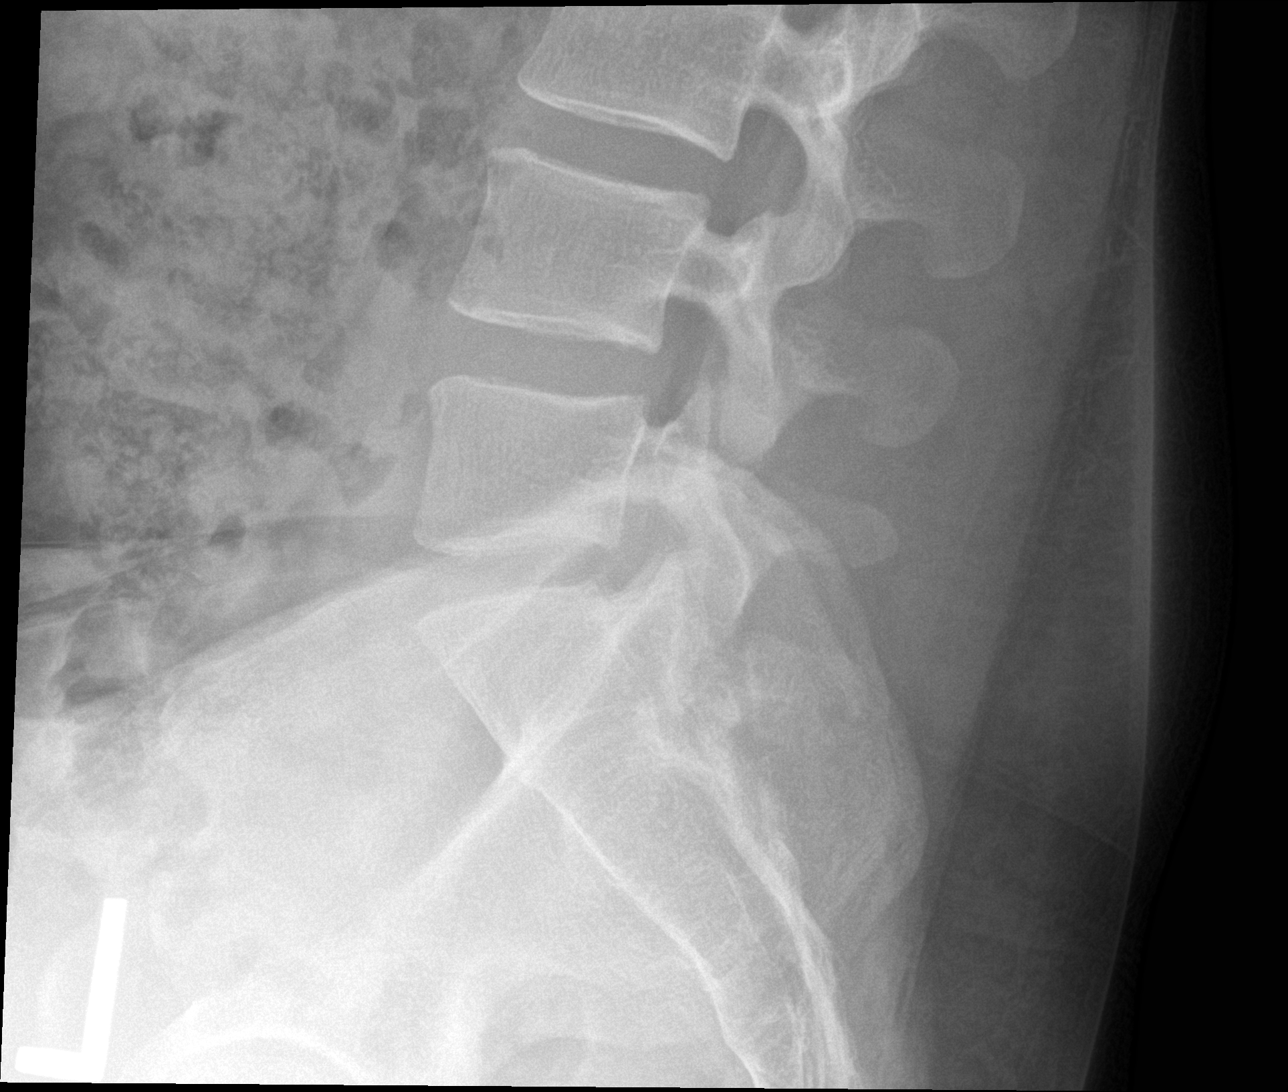

[5 of 5 positions shown; findings below may reference images not displayed]

FINDINGS: Five lumbar vertebrae are presumed for the purposes of this
examination.

There is a subtle lumbar levocurvature. No significant
spondylolisthesis.

Lumbar vertebral body height is maintained.

At L5-S1, there is mild disc degeneration, endplate spurring and
facet arthrosis.

No appreciable pars interarticularis defect.
IMPRESSION: No lumbar compression deformity.

Subtle lumbar levocurvature.

Mild L5-S1 spondylosis as described.

## 2022-09-21 ENCOUNTER — Encounter: Payer: Self-pay | Admitting: *Deleted

## 2023-02-15 ENCOUNTER — Ambulatory Visit (HOSPITAL_BASED_OUTPATIENT_CLINIC_OR_DEPARTMENT_OTHER): Payer: Managed Care, Other (non HMO) | Admitting: Family Medicine

## 2023-05-28 ENCOUNTER — Ambulatory Visit (INDEPENDENT_AMBULATORY_CARE_PROVIDER_SITE_OTHER): Payer: MEDICAID | Admitting: Family Medicine

## 2023-05-28 ENCOUNTER — Encounter: Payer: Self-pay | Admitting: Family Medicine

## 2023-05-28 VITALS — BP 124/80 | HR 85 | Temp 98.1°F | Ht 69.0 in | Wt 218.6 lb

## 2023-05-28 DIAGNOSIS — F1220 Cannabis dependence, uncomplicated: Secondary | ICD-10-CM

## 2023-05-28 DIAGNOSIS — Z6832 Body mass index (BMI) 32.0-32.9, adult: Secondary | ICD-10-CM | POA: Diagnosis not present

## 2023-05-28 DIAGNOSIS — F319 Bipolar disorder, unspecified: Secondary | ICD-10-CM

## 2023-05-28 DIAGNOSIS — E789 Disorder of lipoprotein metabolism, unspecified: Secondary | ICD-10-CM | POA: Diagnosis not present

## 2023-05-28 DIAGNOSIS — Z23 Encounter for immunization: Secondary | ICD-10-CM | POA: Diagnosis not present

## 2023-05-28 LAB — CBC WITH DIFFERENTIAL/PLATELET
Basophils Absolute: 0.1 10*3/uL (ref 0.0–0.1)
Basophils Relative: 0.4 % (ref 0.0–3.0)
Eosinophils Absolute: 0.1 10*3/uL (ref 0.0–0.7)
Eosinophils Relative: 1.3 % (ref 0.0–5.0)
HCT: 43.1 % (ref 39.0–52.0)
Hemoglobin: 14.3 g/dL (ref 13.0–17.0)
Lymphocytes Relative: 33 % (ref 12.0–46.0)
Lymphs Abs: 3.7 10*3/uL (ref 0.7–4.0)
MCHC: 33.2 g/dL (ref 30.0–36.0)
MCV: 88.9 fL (ref 78.0–100.0)
Monocytes Absolute: 0.9 10*3/uL (ref 0.1–1.0)
Monocytes Relative: 7.9 % (ref 3.0–12.0)
Neutro Abs: 6.5 10*3/uL (ref 1.4–7.7)
Neutrophils Relative %: 57.4 % (ref 43.0–77.0)
Platelets: 359 10*3/uL (ref 150.0–400.0)
RBC: 4.85 Mil/uL (ref 4.22–5.81)
RDW: 13.3 % (ref 11.5–15.5)
WBC: 11.3 10*3/uL — ABNORMAL HIGH (ref 4.0–10.5)

## 2023-05-28 LAB — LIPID PANEL
Cholesterol: 149 mg/dL (ref 0–200)
HDL: 32.3 mg/dL — ABNORMAL LOW (ref >39.00)
LDL Cholesterol: 72 mg/dL (ref 0–99)
NonHDL: 116.81
Total CHOL/HDL Ratio: 5
Triglycerides: 225 mg/dL — ABNORMAL HIGH (ref 0.0–149.0)
VLDL: 45 mg/dL — ABNORMAL HIGH (ref 0.0–40.0)

## 2023-05-28 LAB — COMPREHENSIVE METABOLIC PANEL
ALT: 15 U/L (ref 0–53)
AST: 16 U/L (ref 0–37)
Albumin: 4.3 g/dL (ref 3.5–5.2)
Alkaline Phosphatase: 86 U/L (ref 39–117)
BUN: 12 mg/dL (ref 6–23)
CO2: 31 meq/L (ref 19–32)
Calcium: 9.4 mg/dL (ref 8.4–10.5)
Chloride: 103 meq/L (ref 96–112)
Creatinine, Ser: 0.95 mg/dL (ref 0.40–1.50)
GFR: 106.58 mL/min (ref >60.00)
Glucose, Bld: 72 mg/dL (ref 70–99)
Potassium: 4.6 meq/L (ref 3.5–5.1)
Sodium: 140 meq/L (ref 135–145)
Total Bilirubin: 0.4 mg/dL (ref 0.2–1.2)
Total Protein: 6.8 g/dL (ref 6.0–8.3)

## 2023-05-28 NOTE — Patient Instructions (Addendum)
Welcome to [facility]!  We are checking labs today, will be in contact with any results that require further attention  We have given your flu vaccine today.   Follow up with me in about in a year for a physical, sooner if needed

## 2023-05-28 NOTE — Progress Notes (Signed)
New Patient Office Visit  Subjective    Patient ID: Jeff Rogers, male    DOB: Sep 15, 1990  Age: 32 y.o. MRN: [id number]  CC:  Chief Complaint  Patient presents with   Establish Care    Establish Care. No concerns but noted it's been 5+ years since last seeing a provider. PT notes beginning taking better care of themself this year and that setting up a new PCP was the last step remaining in this goal. Requesting labs if possible    HPI Jeff Rogers presents to establish care today. Reports that he sees Dr. Zachery Rogers with psychiatry. She is the one that manages his Adderall and Latuda. Does report that he smokes marijuana almost daily. Reports that these medications are stable. Reports history of high cholesterol, he is fasting today, would like to have labs checked today. Denies other significant medical history. Denies abdominal pain, nausea, vomiting, diarrhea, rash, fever. He is due for flu vaccine, would like to do this today. Medical history as outlined below.   Outpatient Encounter Medications as of 05/28/2023  Medication Sig   amphetamine-dextroamphetamine (ADDERALL XR) 20 MG 24 hr capsule Take 20 mg by mouth daily.   lurasidone (LATUDA) 40 MG TABS tablet Take 1 tablet (40 mg total) by mouth daily with breakfast.   [DISCONTINUED] busPIRone (BUSPAR) 10 MG tablet Take by mouth. (Patient not taking: Reported on 05/28/2023)   [DISCONTINUED] escitalopram (LEXAPRO) 20 MG tablet Take 2 tablets (40 mg total) by mouth daily. (Patient not taking: Reported on 05/28/2023)   [DISCONTINUED] hydrOXYzine (ATARAX/VISTARIL) 50 MG tablet Take 2 tablets (100 mg total) by mouth 3 (three) times daily as needed for anxiety. (Patient not taking: Reported on 05/28/2023)   [DISCONTINUED] PARoxetine (PAXIL) 20 MG tablet Take 20 mg by mouth daily. (Patient not taking: Reported on 05/28/2023)   [DISCONTINUED] predniSONE (DELTASONE) 50 MG tablet Take 1 tablet (50 mg total) by mouth daily. (Patient not  taking: Reported on 05/28/2023)   No facility-administered encounter medications on file as of 05/28/2023.    Past Medical History:  Diagnosis Date   GAD (generalized anxiety disorder) 02/23/2017   Insomnia 09/13/2015   MDD (major depressive disorder) 02/23/2017    History reviewed. No pertinent surgical history.  Family History  Problem Relation Age of Onset   Healthy Mother    Depression Father     Social History   Socioeconomic History   Marital status: Single    Spouse name: Not on file   Number of children: 0   Years of education: Not on file   Highest education level: Some college, no degree  Occupational History   Not on file  Tobacco Use   Smoking status: Former    Current packs/day: 0.00    Types: Cigarettes    Quit date: 10/07/2019    Years since quitting: 3.6   Smokeless tobacco: Never  Substance and Sexual Activity   Alcohol use: No   Drug use: Yes    Types: Marijuana    Comment: Daily. Uses tabacoo leaf to roll, notes of slight nicotine with this   Sexual activity: Not on file  Other Topics Concern   Not on file  Social History Narrative   In college   No caffeine   Social Drivers of Health   Financial Resource Strain: Not on file  Food Insecurity: Not on file  Transportation Needs: Not on file  Physical Activity: Not on file  Stress: Not on file  Social Connections: Unknown (10/17/2021)   [facility]   Social Network    Social Network: Not on file  Intimate Partner Violence: Unknown (09/10/2021)   Received from [facility]   HITS    Physically Hurt: Not on file    Insult or Talk Down To: Not on file    Threaten Physical Harm: Not on file    Scream or Curse: Not on file    ROS Per HPI      Objective    BP 124/80 (BP Location: Right Arm, Patient Position: Sitting)   Pulse 85   Temp 98.1 F (36.7 C) (Oral)   Ht 5\' 9"  (1.753 m)   Wt 218 lb 9.6 oz (99.2 kg)   SpO2 97%   BMI 32.28 kg/m   Physical Exam Vitals and  nursing note reviewed.  Constitutional:      General: He is not in acute distress.    Appearance: Normal appearance.  HENT:     Head: Normocephalic and atraumatic.     Right Ear: Tympanic membrane and ear canal normal.     Left Ear: Tympanic membrane and ear canal normal.     Nose: Nose normal.     Mouth/Throat:     Mouth: Mucous membranes are moist.     Pharynx: Oropharynx is clear.  Eyes:     Extraocular Movements: Extraocular movements intact.     Pupils: Pupils are equal, round, and reactive to light.  Cardiovascular:     Rate and Rhythm: Normal rate and regular rhythm.     Pulses: Normal pulses.     Heart sounds: Normal heart sounds.  Pulmonary:     Effort: Pulmonary effort is normal.     Breath sounds: Normal breath sounds.  Musculoskeletal:        General: Normal range of motion.     Cervical back: Normal range of motion.  Skin:    General: Skin is warm and dry.     Capillary Refill: Capillary refill takes less than 2 seconds.  Neurological:     General: No focal deficit present.     Mental Status: He is alert and oriented to person, place, and time.  Psychiatric:        Mood and Affect: Mood normal.        Behavior: Behavior normal.         Assessment & Plan:   Moderate cannabis use disorder (HCC) -     CBC with Differential/Platelet -     Comprehensive metabolic panel -     Lipid panel  BMI 32.0-32.9,adult -     CBC with Differential/Platelet -     Comprehensive metabolic panel -     Lipid panel  Bipolar 1 disorder (HCC) -     CBC with Differential/Platelet -     Comprehensive metabolic panel -     Lipid panel  Abnormal cholesterol test -     CBC with Differential/Platelet -     Comprehensive metabolic panel -     Lipid panel  Immunization due -     Flu vaccine trivalent PF, 6mos and older(Flulaval,Afluria,Fluarix,Fluzone)     Return in 1 year (on 05/27/2024) for CPE.   Jeff Cipro, FNP

## 2024-05-12 ENCOUNTER — Encounter (HOSPITAL_COMMUNITY): Payer: Self-pay | Admitting: *Deleted

## 2024-05-12 ENCOUNTER — Emergency Department (HOSPITAL_COMMUNITY): Payer: MEDICAID

## 2024-05-12 ENCOUNTER — Other Ambulatory Visit: Payer: Self-pay

## 2024-05-12 ENCOUNTER — Emergency Department (HOSPITAL_COMMUNITY)
Admission: EM | Admit: 2024-05-12 | Discharge: 2024-05-13 | Disposition: A | Payer: MEDICAID | Attending: Emergency Medicine | Admitting: Emergency Medicine

## 2024-05-12 DIAGNOSIS — R002 Palpitations: Secondary | ICD-10-CM | POA: Insufficient documentation

## 2024-05-12 DIAGNOSIS — D72829 Elevated white blood cell count, unspecified: Secondary | ICD-10-CM | POA: Insufficient documentation

## 2024-05-12 LAB — CBC
HCT: 43.8 % (ref 39.0–52.0)
Hemoglobin: 14.7 g/dL (ref 13.0–17.0)
MCH: 29.2 pg (ref 26.0–34.0)
MCHC: 33.6 g/dL (ref 30.0–36.0)
MCV: 87.1 fL (ref 80.0–100.0)
Platelets: 378 K/uL (ref 150–400)
RBC: 5.03 MIL/uL (ref 4.22–5.81)
RDW: 12.2 % (ref 11.5–15.5)
WBC: 11.3 K/uL — ABNORMAL HIGH (ref 4.0–10.5)
nRBC: 0 % (ref 0.0–0.2)

## 2024-05-12 NOTE — ED Triage Notes (Signed)
 Patient reports intermittent palpitations due to drinking large amount of coffee , denies chest pain /no SOB .

## 2024-05-13 LAB — BASIC METABOLIC PANEL WITH GFR
Anion gap: 11 (ref 5–15)
BUN: 16 mg/dL (ref 6–20)
CO2: 22 mmol/L (ref 22–32)
Calcium: 8.8 mg/dL — ABNORMAL LOW (ref 8.9–10.3)
Chloride: 103 mmol/L (ref 98–111)
Creatinine, Ser: 1.2 mg/dL (ref 0.61–1.24)
GFR, Estimated: >60 mL/min (ref >60)
Glucose, Bld: 113 mg/dL — ABNORMAL HIGH (ref 70–99)
Potassium: 3.7 mmol/L (ref 3.5–5.1)
Sodium: 136 mmol/L (ref 135–145)

## 2024-05-13 LAB — TROPONIN I (HIGH SENSITIVITY)
Troponin I (High Sensitivity): 3 ng/L (ref <18)
Troponin I (High Sensitivity): 3 ng/L (ref <18)

## 2024-05-13 NOTE — Discharge Instructions (Signed)
 Avoid caffeine.  Follow up with your primary care provider.

## 2024-05-13 NOTE — ED Provider Notes (Signed)
 [place] EMERGENCY DEPARTMENT AT [facility] Provider Note   CSN: [id number] Arrival date & time: 05/12/24  2300     Patient presents with: Palpitations   Jeff Rogers is a 33 y.o. male.   33 year old male presents with complaint of palpitations.  Patient states that he has not had caffeine in several months.  He skipped his Latuda  and trazodone  for bed the night prior, did not sleep well and when he woke up he drank coffee, had some sort of a caffeinated waffle and later drank a Monster drink as well as his prescribed Adderall.  Patient then developed heart rate of 150s with palpitations.  Denied chest pain or shortness of breath.  He became concerned when he did not take his heart rate was going back down to normal and came to the ER.  His heart rate has since returned to normal, he has no complaints or concerns at this time.  He denies drug use.       Prior to Admission medications   Medication Sig Start Date End Date Taking? Authorizing Provider  amphetamine-dextroamphetamine (ADDERALL XR) 20 MG 24 hr capsule Take 20 mg by mouth daily.    [provider]  lurasidone  (LATUDA ) 40 MG TABS tablet Take 1 tablet (40 mg total) by mouth daily with breakfast. 07/10/20   Willo Mini, NP    Allergies: Patient has no known allergies.    Review of Systems Negative except as per HPI Updated Vital Signs BP 116/72   Pulse 89   Temp 97.7 F (36.5 C)   Resp 16   Ht 5' 9 (1.753 m)   Wt 99.2 kg   SpO2 98%   BMI 32.30 kg/m   Physical Exam Vitals and nursing note reviewed.  Constitutional:      General: He is not in acute distress.    Appearance: He is well-developed. He is not diaphoretic.  HENT:     Head: Normocephalic and atraumatic.  Cardiovascular:     Rate and Rhythm: Normal rate and regular rhythm.     Pulses: Normal pulses.     Heart sounds: Normal heart sounds.  Pulmonary:     Effort: Pulmonary effort is normal.     Breath sounds: Normal breath  sounds.  Abdominal:     Palpations: Abdomen is soft.     Tenderness: There is no abdominal tenderness.  Musculoskeletal:     Right lower leg: No edema.     Left lower leg: No edema.  Skin:    General: Skin is warm and dry.  Neurological:     Mental Status: He is alert and oriented to person, place, and time.  Psychiatric:        Behavior: Behavior normal.     (all labs ordered are listed, but only abnormal results are displayed) Labs Reviewed  BASIC METABOLIC PANEL WITH GFR - Abnormal; Notable for the following components:      Result Value   Glucose, Bld 113 (*)    Calcium 8.8 (*)    All other components within normal limits  CBC - Abnormal; Notable for the following components:   WBC 11.3 (*)    All other components within normal limits  TROPONIN I (HIGH SENSITIVITY)  TROPONIN I (HIGH SENSITIVITY)    EKG: None  Radiology: DG Chest 2 View Result Date: 05/12/2024 EXAM: 2 VIEW(S) XRAY OF THE CHEST 05/12/2024 11:52:03 PM COMPARISON: None available. CLINICAL HISTORY: Tachycardia intermittent since yesterday. FINDINGS: LUNGS AND PLEURA: No focal pulmonary  opacity. No pleural effusion. No pneumothorax. HEART AND MEDIASTINUM: No acute abnormality of the cardiac and mediastinal silhouettes. BONES AND SOFT TISSUES: No acute osseous abnormality. IMPRESSION: 1. No acute process. Electronically signed by: Dorethia Molt MD 05/12/2024 11:55 PM EST RP Workstation: [id number]     Procedures   Medications Ordered in the ED - No data to display                                  Medical Decision Making  This patient presents to the ED for concern of palpitations, this involves an extensive number of treatment options, and is a complaint that carries with it a high risk of complications and morbidity.  The differential diagnosis includes but not limited to caffeine, substance abuse, thyroid disorder,    Co morbidities / Chronic conditions that complicate the patient  evaluation  Insomnia, MDD, GAD   Additional history obtained:  Additional history obtained from EMR External records from outside source obtained and reviewed including prior labs on file    Lab Tests:  I Ordered, and personally interpreted labs.  The pertinent results include: Troponin negative x 2.  CBC with nonspecific leukocytosis of 11.3.  BMP without significant findings.   Imaging Studies ordered:  I ordered imaging studies including chest x-ray I independently visualized and interpreted imaging which showed no acute process I agree with the radiologist interpretation   Cardiac Monitoring: / EKG:  The patient was maintained on a cardiac monitor.  I personally viewed and interpreted the cardiac monitored which showed an underlying rhythm of: Sinus tachycardia, rate 118   Problem List / ED Course / Critical interventions / Medication management  33 year old male presents emergency room with concern for palpitations after ingesting a lot of caffeine the prior day.  On arrival, patient was mildly tachycardic with a rate of 102.  Vitals normal now with heart rate of 89 on last check.  Patient states his symptoms have completely resolved, he is feeling well and ready for discharge.  Symptoms likely related to his caffeine ingestion as he does not normally drink caffeine and denies drug use.  Advised patient to avoid caffeine.  Should his symptoms recur, recommend follow-up with primary care to discuss referral to cardiology versus present to the ER. I have reviewed the patients home medicines and have made adjustments as needed   Social Determinants of Health:  Has PCP   Test / Admission - Considered:  Stable for dc      Final diagnoses:  Palpitations    ED Discharge Orders     None          Beverley Leita LABOR, PA-C 05/13/24 0432    Theadore Ozell HERO, MD 05/13/24 [phone]

## 2024-06-26 ENCOUNTER — Ambulatory Visit: Payer: MEDICAID | Admitting: Family Medicine

## 2024-06-26 ENCOUNTER — Encounter: Payer: Self-pay | Admitting: Family Medicine

## 2024-06-26 VITALS — BP 130/84 | HR 110 | Temp 98.2°F | Ht 69.0 in | Wt 238.6 lb

## 2024-06-26 DIAGNOSIS — E66812 Obesity, class 2: Secondary | ICD-10-CM

## 2024-06-26 DIAGNOSIS — R350 Frequency of micturition: Secondary | ICD-10-CM | POA: Diagnosis not present

## 2024-06-26 DIAGNOSIS — R030 Elevated blood-pressure reading, without diagnosis of hypertension: Secondary | ICD-10-CM

## 2024-06-26 DIAGNOSIS — R300 Dysuria: Secondary | ICD-10-CM | POA: Diagnosis not present

## 2024-06-26 DIAGNOSIS — Z6835 Body mass index (BMI) 35.0-35.9, adult: Secondary | ICD-10-CM

## 2024-06-26 DIAGNOSIS — R000 Tachycardia, unspecified: Secondary | ICD-10-CM | POA: Diagnosis not present

## 2024-06-26 LAB — POCT URINALYSIS DIP (CLINITEK)
Bilirubin, UA: NEGATIVE
Blood, UA: NEGATIVE
Glucose, UA: NEGATIVE mg/dL
Ketones, POC UA: NEGATIVE mg/dL
Leukocytes, UA: NEGATIVE
Nitrite, UA: NEGATIVE
POC PROTEIN,UA: NEGATIVE
Spec Grav, UA: 1.025
Urobilinogen, UA: 0.2 U/dL
pH, UA: 6

## 2024-06-26 LAB — CBC WITH DIFFERENTIAL/PLATELET
Basophils Absolute: 0 K/uL (ref 0.0–0.1)
Basophils Relative: 0.4 % (ref 0.0–3.0)
Eosinophils Absolute: 0.1 K/uL (ref 0.0–0.7)
Eosinophils Relative: 0.8 % (ref 0.0–5.0)
HCT: 41.1 % (ref 39.0–52.0)
Hemoglobin: 14.2 g/dL (ref 13.0–17.0)
Lymphocytes Relative: 27.6 % (ref 12.0–46.0)
Lymphs Abs: 2.6 K/uL (ref 0.7–4.0)
MCHC: 34.6 g/dL (ref 30.0–36.0)
MCV: 85.2 fl (ref 78.0–100.0)
Monocytes Absolute: 0.6 K/uL (ref 0.1–1.0)
Monocytes Relative: 6.8 % (ref 3.0–12.0)
Neutro Abs: 6 K/uL (ref 1.4–7.7)
Neutrophils Relative %: 64.4 % (ref 43.0–77.0)
Platelets: 322 K/uL (ref 150.0–400.0)
RBC: 4.83 Mil/uL (ref 4.22–5.81)
RDW: 13.2 % (ref 11.5–15.5)
WBC: 9.4 K/uL (ref 4.0–10.5)

## 2024-06-26 LAB — COMPREHENSIVE METABOLIC PANEL WITH GFR
ALT: 11 U/L (ref 3–53)
AST: 13 U/L (ref 5–37)
Albumin: 4.7 g/dL (ref 3.5–5.2)
Alkaline Phosphatase: 77 U/L (ref 39–117)
BUN: 10 mg/dL (ref 6–23)
CO2: 27 meq/L (ref 19–32)
Calcium: 9.1 mg/dL (ref 8.4–10.5)
Chloride: 106 meq/L (ref 96–112)
Creatinine, Ser: 0.96 mg/dL (ref 0.40–1.50)
GFR: 104.45 mL/min
Glucose, Bld: 108 mg/dL — ABNORMAL HIGH (ref 70–99)
Potassium: 3.8 meq/L (ref 3.5–5.1)
Sodium: 140 meq/L (ref 135–145)
Total Bilirubin: 0.4 mg/dL (ref 0.2–1.2)
Total Protein: 7 g/dL (ref 6.0–8.3)

## 2024-06-26 LAB — TSH: TSH: 0.68 u[IU]/mL (ref 0.35–5.50)

## 2024-06-26 NOTE — Progress Notes (Signed)
 "  Acute Office Visit  Subjective:     Patient ID: Jeff Rogers, male    DOB: [birth year], 34 y.o.   MRN: [id number]  No chief complaint on file.   HPI  Discussed the use of AI scribe software for clinical note transcription with the patient, who gave verbal consent to proceed.  History of Present Illness Jeff Rogers is a 34 year old male who presents with urinary frequency and burning sensation during urination.  Lower urinary tract symptoms - Urinary frequency varies day to day - Persistent urge to urinate even when little urine is passed - Burning sensation during urination - No penile discharge - No testicular swelling or pain - No recent illness - Similar episode occurred two months ago, associated with high caffeine intake from energy drinks - Current episode followed Adderall intake at 9 AM and coffee at 11 AM, which he believes may have triggered symptoms  Weight gain and reduced physical activity - Gained approximately 20 pounds over the past six weeks - Attributed to poor diet and reduced activity after gym closure - Daily steps decreased from 8,000-10,000 to about 3,000  Medication and substance use - Latuda  for over one year - Trazodone  for over six months - Adderall for unknown duration - Alcohol use is social - Recently started vaping every other day     ROS Per HPI      Objective:    BP 130/84   Pulse (!) 110   Temp 98.2 F (36.8 C) (Temporal)   Ht 5' 9 (1.753 m)   Wt 238 lb 9.6 oz (108.2 kg)   SpO2 97%   BMI 35.24 kg/m    Physical Exam Vitals and nursing note reviewed.  Constitutional:      General: He is not in acute distress.    Appearance: Normal appearance. He is obese.  HENT:     Head: Normocephalic and atraumatic.     Right Ear: External ear normal.     Left Ear: External ear normal.     Nose: Nose normal.     Mouth/Throat:     Mouth: Mucous membranes are moist.     Pharynx: Oropharynx is clear.  Eyes:     Extraocular  Movements: Extraocular movements intact.  Cardiovascular:     Rate and Rhythm: Regular rhythm. Tachycardia present.     Pulses: Normal pulses.     Heart sounds: Normal heart sounds.  Pulmonary:     Effort: Pulmonary effort is normal. No respiratory distress.     Breath sounds: Normal breath sounds. No wheezing, rhonchi or rales.  Musculoskeletal:        General: Normal range of motion.     Cervical back: Normal range of motion.     Right lower leg: No edema.     Left lower leg: No edema.  Lymphadenopathy:     Cervical: No cervical adenopathy.  Skin:    General: Skin is warm and dry.  Neurological:     General: No focal deficit present.     Mental Status: He is alert and oriented to person, place, and time.  Psychiatric:        Mood and Affect: Mood normal.        Behavior: Behavior normal.     Results for orders placed or performed in visit on 06/26/24  Urine Culture   Specimen: Blood  Result Value Ref Range   Source: NOT GIVEN    Status: FINAL    Result: No Growth  CBC with Differential/Platelet  Result Value Ref Range   WBC 9.4 4.0 - 10.5 K/uL   RBC 4.83 4.22 - 5.81 Mil/uL   Hemoglobin 14.2 13.0 - 17.0 g/dL   HCT 58.8 60.9 - 47.9 %   MCV 85.2 78.0 - 100.0 fl   MCHC 34.6 30.0 - 36.0 g/dL   RDW 86.7 88.4 - 84.4 %   Platelets 322.0 150.0 - 400.0 K/uL   Neutrophils Relative % 64.4 43.0 - 77.0 %   Lymphocytes Relative 27.6 12.0 - 46.0 %   Monocytes Relative 6.8 3.0 - 12.0 %   Eosinophils Relative 0.8 0.0 - 5.0 %   Basophils Relative 0.4 0.0 - 3.0 %   Neutro Abs 6.0 1.4 - 7.7 K/uL   Lymphs Abs 2.6 0.7 - 4.0 K/uL   Monocytes Absolute 0.6 0.1 - 1.0 K/uL   Eosinophils Absolute 0.1 0.0 - 0.7 K/uL   Basophils Absolute 0.0 0.0 - 0.1 K/uL  Comprehensive metabolic panel with GFR  Result Value Ref Range   Sodium 140 135 - 145 mEq/L   Potassium 3.8 3.5 - 5.1 mEq/L   Chloride 106 96 - 112 mEq/L   CO2 27 19 - 32 mEq/L   Glucose, Bld 108 (H) 70 - 99 mg/dL   BUN 10 6 - 23  mg/dL   Creatinine, Ser 9.03 0.40 - 1.50 mg/dL   Total Bilirubin 0.4 0.2 - 1.2 mg/dL   Alkaline Phosphatase 77 39 - 117 U/L   AST 13 5 - 37 U/L   ALT 11 3 - 53 U/L   Total Protein 7.0 6.0 - 8.3 g/dL   Albumin 4.7 3.5 - 5.2 g/dL   GFR 895.54 >39.99 mL/min   Calcium 9.1 8.4 - 10.5 mg/dL  TSH  Result Value Ref Range   TSH 0.68 0.35 - 5.50 uIU/mL  Thyroid  Profile  Result Value Ref Range   T4, Total 7.9 4.5 - 12.0 ug/dL   T3 Uptake Ratio 28 24 - 39 %   Free Thyroxine Index 2.2 1.2 - 4.9  POCT URINALYSIS DIP (CLINITEK)  Result Value Ref Range   Color, UA yellow yellow   Clarity, UA clear clear   Glucose, UA negative negative mg/dL   Bilirubin, UA negative negative   Ketones, POC UA negative negative mg/dL   Spec Grav, UA 8.974 8.989 - 1.025   Blood, UA negative negative   pH, UA 6.0 5.0 - 8.0   POC PROTEIN,UA negative negative, trace   Urobilinogen, UA 0.2 0.2 or 1.0 E.U./dL   Nitrite, UA Negative Negative   Leukocytes, UA Negative Negative        Assessment & Plan:   Assessment and Plan Assessment & Plan Urinary frequency with mild dysuria Intermittent urinary frequency with mild dysuria. Urinalysis pending to rule out infection or other causes. - Await urinalysis results to guide further management.  Tachycardia and elevated blood pressure reading without diagnosis of hypertension Likely related to caffeine and Adderall use. Blood pressure and heart rate elevated during visit. - Continue to monitor blood pressure and heart rate.  Class II severe obesity due to excess calories with BMI 35 with serious comorbidity Weight gain of approximately 20 pounds over the past six weeks. Contributing factors include poor diet, decreased physical activity, and gym closure. Vaping habit noted. - Comorbid bipolar 1, ADHD, MDD  Bipolar 1 disorder, ADHD, MDD, recurrent, moderate Managed with Lurasidone  (Latuda ) and Amphetamine-dextroamphetamine (Adderall XR). No recent exacerbations  reported. - Is followed by [facility] and meds  are managed by them      Orders Placed This Encounter  Procedures   Urine Culture    Standing Status:   Future    Number of Occurrences:   1    Expected Date:   06/26/2024    Expiration Date:   06/26/2025   CBC with Differential/Platelet    Release to patient:   Immediate [1]   Comprehensive metabolic panel with GFR    Release to patient:   Immediate [1]   TSH   Thyroid  Profile   POCT URINALYSIS DIP (CLINITEK)   EKG 12-Lead     No orders of the defined types were placed in this encounter.   Return if symptoms worsen or fail to improve.  Corean LITTIE Ku, FNP  "

## 2024-06-26 NOTE — Patient Instructions (Signed)
 We are checking labs today, will be in contact with any results that require further attention.  Urine looks ok today. We will send your urine for culture and will let you know when results are received.   Follow-up with me for new or worsening symptoms.

## 2024-06-27 LAB — URINE CULTURE: Result:: NO GROWTH

## 2024-06-27 LAB — THYROID PANEL
Free Thyroxine Index: 2.2 (ref 1.2–4.9)
T3 Uptake Ratio: 28 % (ref 24–39)
T4, Total: 7.9 ug/dL (ref 4.5–12.0)

## 2024-07-05 ENCOUNTER — Ambulatory Visit: Payer: Self-pay | Admitting: Family Medicine
